# Patient Record
Sex: Female | Born: 1940 | Race: Black or African American | Hispanic: No | Marital: Single | Smoking: Never smoker
Health system: Southern US, Community
[De-identification: ages and names within clinical notes are randomized; demographics above are authoritative.]

## PROBLEM LIST (undated history)

## (undated) DIAGNOSIS — E119 Type 2 diabetes mellitus without complications: Secondary | ICD-10-CM

## (undated) DIAGNOSIS — I10 Essential (primary) hypertension: Secondary | ICD-10-CM

## (undated) DIAGNOSIS — M1990 Unspecified osteoarthritis, unspecified site: Secondary | ICD-10-CM

## (undated) DIAGNOSIS — E7800 Pure hypercholesterolemia, unspecified: Secondary | ICD-10-CM

## (undated) DIAGNOSIS — K219 Gastro-esophageal reflux disease without esophagitis: Secondary | ICD-10-CM

## (undated) HISTORY — PX: TONSILLECTOMY: SUR1361

## (undated) HISTORY — PX: TUBAL LIGATION: SHX77

## (undated) HISTORY — PX: JOINT REPLACEMENT: SHX530

---

## 2011-03-26 DIAGNOSIS — H2510 Age-related nuclear cataract, unspecified eye: Secondary | ICD-10-CM | POA: Insufficient documentation

## 2011-03-26 DIAGNOSIS — H40003 Preglaucoma, unspecified, bilateral: Secondary | ICD-10-CM | POA: Insufficient documentation

## 2011-03-26 DIAGNOSIS — E119 Type 2 diabetes mellitus without complications: Secondary | ICD-10-CM | POA: Insufficient documentation

## 2011-03-26 DIAGNOSIS — H524 Presbyopia: Secondary | ICD-10-CM | POA: Insufficient documentation

## 2011-03-26 DIAGNOSIS — H5200 Hypermetropia, unspecified eye: Secondary | ICD-10-CM | POA: Insufficient documentation

## 2015-03-09 ENCOUNTER — Other Ambulatory Visit: Payer: Self-pay

## 2015-03-09 ENCOUNTER — Other Ambulatory Visit: Payer: Self-pay | Admitting: Internal Medicine

## 2015-03-09 DIAGNOSIS — Z1231 Encounter for screening mammogram for malignant neoplasm of breast: Secondary | ICD-10-CM

## 2015-03-09 DIAGNOSIS — E2839 Other primary ovarian failure: Secondary | ICD-10-CM

## 2015-03-29 ENCOUNTER — Inpatient Hospital Stay: Admission: RE | Admit: 2015-03-29 | Payer: Self-pay | Source: Ambulatory Visit

## 2015-04-08 ENCOUNTER — Ambulatory Visit
Admission: RE | Admit: 2015-04-08 | Discharge: 2015-04-08 | Disposition: A | Discharge status: Home / Self Care | Payer: Medicare HMO | Source: Ambulatory Visit

## 2015-04-08 ENCOUNTER — Ambulatory Visit
Admission: RE | Admit: 2015-04-08 | Discharge: 2015-04-08 | Disposition: A | Discharge status: Home / Self Care | Payer: Medicare HMO | Source: Ambulatory Visit | Attending: Internal Medicine | Admitting: Internal Medicine

## 2015-04-08 DIAGNOSIS — E2839 Other primary ovarian failure: Secondary | ICD-10-CM

## 2015-04-08 DIAGNOSIS — Z1231 Encounter for screening mammogram for malignant neoplasm of breast: Secondary | ICD-10-CM

## 2015-05-04 ENCOUNTER — Other Ambulatory Visit: Payer: Self-pay | Admitting: Internal Medicine

## 2015-05-04 DIAGNOSIS — R928 Other abnormal and inconclusive findings on diagnostic imaging of breast: Secondary | ICD-10-CM

## 2015-05-11 ENCOUNTER — Other Ambulatory Visit: Payer: Self-pay | Admitting: Internal Medicine

## 2015-05-11 ENCOUNTER — Ambulatory Visit
Admission: RE | Admit: 2015-05-11 | Discharge: 2015-05-11 | Disposition: A | Discharge status: Home / Self Care | Payer: Medicare HMO | Source: Ambulatory Visit | Attending: Internal Medicine | Admitting: Internal Medicine

## 2015-05-11 DIAGNOSIS — R928 Other abnormal and inconclusive findings on diagnostic imaging of breast: Secondary | ICD-10-CM

## 2015-05-16 ENCOUNTER — Other Ambulatory Visit: Payer: Self-pay | Admitting: Internal Medicine

## 2015-05-16 ENCOUNTER — Ambulatory Visit
Admission: RE | Admit: 2015-05-16 | Discharge: 2015-05-16 | Disposition: A | Discharge status: Home / Self Care | Payer: Medicare HMO | Source: Ambulatory Visit | Attending: Internal Medicine | Admitting: Internal Medicine

## 2015-05-16 DIAGNOSIS — R928 Other abnormal and inconclusive findings on diagnostic imaging of breast: Secondary | ICD-10-CM

## 2015-05-30 ENCOUNTER — Ambulatory Visit: Payer: Self-pay | Admitting: Surgery

## 2015-05-30 NOTE — H&P (Signed)
Lisa Alexander 05/30/2015 1:36 PM Location: [facility] Patient #: [id number] DOB: 16-Jul-1940 Single / Language: [place] / Race: Black or African American Female  History of Present Illness ([name] MD; 05/30/2015 4:03 PM) Patient words: Patient is sent at the request of Dr. 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 Patient denies any history of breast pain, nipple discharge or change the appearance of either breast. This was picked up due to abnormal mammogram. She has no complaints.                   CLINICAL DATA: The patient returns after screening study for evaluation possible distortion in the right breast. EXAM: DIGITAL DIAGNOSTIC RIGHT MAMMOGRAM WITH 3D TOMOSYNTHESIS WITH CAD ULTRASOUND RIGHT BREAST COMPARISON: 04/08/2015 and earlier ACR Breast Density Category b: There are scattered areas of fibroglandular density. FINDINGS: Additional views are performed, confirming presence of distortion in the lateral portion of the right breast. This is not associated with a mass and therefore is more difficult to measure. However the area of distortion is estimated to be 1.9 cm in diameter. Mammographic images were processed with CAD. On physical exam, I palpate no abnormality in the lateral aspect of the right breast. Targeted ultrasound is performed, showing normal appearing fibroglandular tissue throughout the lateral and retroareolar portion of the right breast. IMPRESSION: 1. Persistent, suspicious distortion in the lateral portion of the right breast warranting tissue diagnosis. 2. No sonographic correlate for this finding. RECOMMENDATION: Stereotactic guided core biopsy is recommended and scheduled for the patient at the 05/16/2015 at 11 o'clock a.m. Patient takes a baby aspirin daily because of a history of hypertension and diabetes. She will discontinue aspirin 5 days prior to  biopsy. I have discussed the findings and recommendations with the patient. Results were also provided in writing at the conclusion of the visit. If applicable, a reminder letter will be sent to the patient regarding the next appointment. BI-RADS CATEGORY 4: Suspicious. Electronically Signed By: Norva Pavlov M.D. On: 05/11/2015 14:51          Diagnosis Breast, right, needle core biopsy, UOQ - COMPLEX SCLEROSING LESION, SEE COMMENT.  The patient is a 75 year old female.   Other Problems Lisa Alexander, CMA; 05/30/2015 1:36 PM) Arthritis Asthma Bladder Problems Diabetes Mellitus Gastroesophageal Reflux Disease High blood pressure  Past Surgical History Lisa Alexander, CMA; 05/30/2015 1:36 PM) Breast Biopsy Right. Colon Polyp Removal - Colonoscopy Colon Polyp Removal - Open Knee Surgery Left. Tonsillectomy  Diagnostic Studies History Lisa Alexander, [place]; 05/30/2015 1:36 PM) Colonoscopy within last year Mammogram 1-3 years ago  Allergies Lisa Alexander, CMA; 05/30/2015 1:37 PM) No Known Drug Allergies 05/30/2015  Medication History Lisa Alexander, CMA; 05/30/2015 1:41 PM) Omeprazole (  Capsule DR, Oral) Active. Aspirin (  Tablet, Oral) Active. Simvastatin (  Tablet, Oral) Active. MetFORMIN HCl (  Tablet, Oral) Active. Atenolol (  Tablet, Oral) Active. Ibuprofen (  Tablet, Oral) Active. Valsartan-Hydrochlorothiazide (320-12.5MG  Tablet, Oral) Active. Insurance claims handler (Oral) Active. Tanzeum (  Pen-injector, Subcutaneous) Active. Medications Reconciled Lantus (100UNIT/ML Solution, Subcutaneous) Active.  Social History Lisa Alexander, [place]; 05/30/2015 1:36 PM) Caffeine use Carbonated beverages. No alcohol use No drug use Tobacco use Never smoker.  Family History Lisa Alexander, [place]; 05/30/2015 1:36 PM) Arthritis Mother. Diabetes Mellitus Sister. Hypertension Mother, Son.  Pregnancy / Birth History Lisa Alexander, CMA;  05/30/2015 1:36 PM) Age at menarche 12 years. Age of menopause 44-50 Contraceptive History Oral contraceptives. Gravida 4 Maternal age 32-20 Para 4  Review of Systems Lisa Alexander CMA; 05/30/2015 1:36 PM) General Present- Chills and Weight Loss. Not Present- Appetite Loss, Fatigue, Fever, Night Sweats and Weight Gain. Skin Present- Dryness. Not Present- Change in Wart/Mole, Hives, Jaundice, New Lesions, Non-Healing Wounds, Rash and Ulcer. HEENT Present- Hoarseness, Seasonal Allergies, Sinus Pain, Visual Disturbances and Wears glasses/contact lenses. Not Present- Earache, Hearing Loss, Nose Bleed, Oral Ulcers, Ringing in the Ears, Sore Throat and Yellow Eyes. Respiratory Present- Snoring. Not Present- Bloody sputum, Chronic Cough, Difficulty Breathing and Wheezing. Cardiovascular Present- Leg Cramps. Not Present- Chest Pain, Difficulty Breathing Lying Down, Palpitations, Rapid Heart Rate, Shortness of Breath and Swelling of Extremities. Gastrointestinal Present- Excessive gas. Not Present- Abdominal Pain, Bloating, Bloody Stool, Change in Bowel Habits, Chronic diarrhea, Constipation, Difficulty Swallowing, Gets full quickly at meals, Hemorrhoids, Indigestion, Nausea, Rectal Pain and Vomiting. Female Genitourinary Present- Frequency, Nocturia and Urgency. Not Present- Painful Urination and Pelvic Pain. Musculoskeletal Present- Joint Stiffness and Muscle Pain. Not Present- Back Pain, Joint Pain, Muscle Weakness and Swelling of Extremities. Neurological Present- Headaches, Trouble walking and Weakness. Not Present- Decreased Memory, Fainting, Numbness, Seizures, Tingling and Tremor. Endocrine Present- Hair Changes. Not Present- Cold Intolerance, Excessive Hunger, Heat Intolerance, Hot flashes and New Diabetes. Hematology Present- Easy Bruising. Not Present- Excessive bleeding, Gland problems, HIV and Persistent Infections.  Vitals Lisa Alexander CMA; 05/30/2015 1:40 PM) 05/30/2015 1:40  PM Weight: 188.8 lb Height: 63in Body Surface Area: 1.89 m Body Mass Index: 33.44 kg/m  Temp.: 97.74F(Temporal)  Pulse: 60 (Regular)  BP: 128/78 (Sitting, Left Arm, Standard)      Physical Exam (Tasmin Exantus A. Tawnee Clegg MD; 05/30/2015 4:04 PM)  General Mental Status-Alert. General Appearance-Consistent with stated age. Hydration-Well hydrated. Voice-Normal.  Head and Neck Head-normocephalic, atraumatic with no lesions or palpable masses. Trachea-midline. Thyroid Gland Characteristics - normal size and consistency.  Breast Note: Right breast swelling at core biopsy site. No left breast mass noted.  Cardiovascular Cardiovascular examination reveals -normal heart sounds, regular rate and rhythm with no murmurs and normal pedal pulses bilaterally.  Neurologic Neurologic evaluation reveals -alert and oriented x 3 with no impairment of recent or remote memory. Mental Status-Normal.  Lymphatic Axillary  General Axillary Region: Bilateral - Description - Normal. Tenderness - Non Tender.    Assessment & Plan ([place] Kievit A. Majed Pellegrin MD; 05/30/2015 4:04 PM)  BREAST MASS, RIGHT (N63)  MASS OF RIGHT BREAST ON MAMMOGRAM (N63) Impression: complex sclerosing lesion recommend right breast seed localized lumpectomy discussed observation/ serial mammograms low risk of malignancy discussed Risk of lumpectomy include bleeding, infection, seroma, more surgery, use of seed/wire, wound care, cosmetic deformity and the need for other treatments, death , blood clots, death. Pt agrees to proceed.  Current Plans You are being scheduled for surgery - Our schedulers will call you.  You should hear from our office's scheduling department within 5 working days about the location, date, and time of surgery. We try to make accommodations for patient's preferences in scheduling surgery, but sometimes the OR schedule or the surgeon's schedule prevents [place] from making those  accommodations.  If you have not heard from our office [phone]) in 5 working days, call the office and ask for your surgeon's nurse.  If you have other questions about your diagnosis, plan, or surgery, call the office and ask for your surgeon's nurse.  Pt Education - [facility] Breast Biopsy HCI: discussed with patient and provided information. The anatomy and the physiology was discussed. The pathophysiology and natural history of the disease was discussed. Options were discussed and  recommendations were made. Technique, risks, benefits, & alternatives were discussed. Risks such as stroke, heart attack, bleeding, indection, death, and other risks discussed. Questions answered. The patient agrees to proceed.

## 2015-06-09 ENCOUNTER — Other Ambulatory Visit: Payer: Self-pay | Admitting: Surgery

## 2015-06-09 ENCOUNTER — Ambulatory Visit: Payer: Self-pay | Admitting: Surgery

## 2015-06-09 DIAGNOSIS — N6310 Unspecified lump in the right breast, unspecified quadrant: Secondary | ICD-10-CM

## 2015-06-14 ENCOUNTER — Encounter (HOSPITAL_BASED_OUTPATIENT_CLINIC_OR_DEPARTMENT_OTHER): Payer: Self-pay | Admitting: *Deleted

## 2015-06-15 ENCOUNTER — Other Ambulatory Visit: Payer: Self-pay

## 2015-06-15 ENCOUNTER — Encounter (HOSPITAL_BASED_OUTPATIENT_CLINIC_OR_DEPARTMENT_OTHER)
Admission: RE | Admit: 2015-06-15 | Discharge: 2015-06-15 | Disposition: A | Discharge status: Home / Self Care | Payer: Medicare HMO | Source: Ambulatory Visit | Attending: Surgery | Admitting: Surgery

## 2015-06-15 DIAGNOSIS — N63 Unspecified lump in breast: Secondary | ICD-10-CM | POA: Diagnosis not present

## 2015-06-15 DIAGNOSIS — Z01810 Encounter for preprocedural cardiovascular examination: Secondary | ICD-10-CM | POA: Insufficient documentation

## 2015-06-15 DIAGNOSIS — Z01812 Encounter for preprocedural laboratory examination: Secondary | ICD-10-CM | POA: Diagnosis present

## 2015-06-15 DIAGNOSIS — I10 Essential (primary) hypertension: Secondary | ICD-10-CM | POA: Insufficient documentation

## 2015-06-15 DIAGNOSIS — Z833 Family history of diabetes mellitus: Secondary | ICD-10-CM | POA: Diagnosis not present

## 2015-06-15 DIAGNOSIS — E119 Type 2 diabetes mellitus without complications: Secondary | ICD-10-CM | POA: Diagnosis not present

## 2015-06-15 DIAGNOSIS — R928 Other abnormal and inconclusive findings on diagnostic imaging of breast: Secondary | ICD-10-CM | POA: Diagnosis not present

## 2015-06-15 LAB — BASIC METABOLIC PANEL
Anion gap: 8 (ref 5–15)
BUN: 30 mg/dL — AB (ref 6–20)
CHLORIDE: 102 mmol/L (ref 101–111)
CO2: 29 mmol/L (ref 22–32)
CREATININE: 1.21 mg/dL — AB (ref 0.44–1.00)
Calcium: 10.3 mg/dL (ref 8.9–10.3)
GFR calc Af Amer: 50 mL/min — ABNORMAL LOW (ref >60)
GFR calc non Af Amer: 43 mL/min — ABNORMAL LOW (ref >60)
Glucose, Bld: 102 mg/dL — ABNORMAL HIGH (ref 65–99)
Potassium: 4.7 mmol/L (ref 3.5–5.1)
Sodium: 139 mmol/L (ref 135–145)

## 2015-06-16 ENCOUNTER — Ambulatory Visit
Admission: RE | Admit: 2015-06-16 | Discharge: 2015-06-16 | Disposition: A | Discharge status: Home / Self Care | Payer: Medicare HMO | Source: Ambulatory Visit | Attending: Surgery | Admitting: Surgery

## 2015-06-16 DIAGNOSIS — N6310 Unspecified lump in the right breast, unspecified quadrant: Secondary | ICD-10-CM

## 2015-06-16 NOTE — Progress Notes (Addendum)
Dr.Judd reviewed BUN 30 and  Creat 1.21 - ok for surgery. Dr. Blima Dessert reviewed Ekg - ok for Surgery

## 2015-06-21 ENCOUNTER — Ambulatory Visit
Admission: RE | Admit: 2015-06-21 | Discharge: 2015-06-21 | Disposition: A | Discharge status: Home / Self Care | Payer: Medicare HMO | Source: Ambulatory Visit | Attending: Surgery | Admitting: Surgery

## 2015-06-21 ENCOUNTER — Encounter (HOSPITAL_BASED_OUTPATIENT_CLINIC_OR_DEPARTMENT_OTHER)
Admission: RE | Disposition: A | Discharge status: Home / Self Care | Payer: Self-pay | Source: Ambulatory Visit | Attending: Surgery

## 2015-06-21 ENCOUNTER — Ambulatory Visit (HOSPITAL_BASED_OUTPATIENT_CLINIC_OR_DEPARTMENT_OTHER): Payer: Medicare HMO | Admitting: Anesthesiology

## 2015-06-21 ENCOUNTER — Encounter (HOSPITAL_BASED_OUTPATIENT_CLINIC_OR_DEPARTMENT_OTHER): Payer: Self-pay | Admitting: *Deleted

## 2015-06-21 ENCOUNTER — Ambulatory Visit (HOSPITAL_BASED_OUTPATIENT_CLINIC_OR_DEPARTMENT_OTHER)
Admission: RE | Admit: 2015-06-21 | Discharge: 2015-06-21 | Disposition: A | Discharge status: Home / Self Care | Payer: Medicare HMO | Source: Ambulatory Visit | Attending: Surgery | Admitting: Surgery

## 2015-06-21 DIAGNOSIS — Z833 Family history of diabetes mellitus: Secondary | ICD-10-CM | POA: Diagnosis not present

## 2015-06-21 DIAGNOSIS — N63 Unspecified lump in breast: Secondary | ICD-10-CM | POA: Diagnosis not present

## 2015-06-21 DIAGNOSIS — E119 Type 2 diabetes mellitus without complications: Secondary | ICD-10-CM | POA: Diagnosis not present

## 2015-06-21 DIAGNOSIS — R928 Other abnormal and inconclusive findings on diagnostic imaging of breast: Secondary | ICD-10-CM | POA: Insufficient documentation

## 2015-06-21 DIAGNOSIS — N6310 Unspecified lump in the right breast, unspecified quadrant: Secondary | ICD-10-CM

## 2015-06-21 DIAGNOSIS — N649 Disorder of breast, unspecified: Secondary | ICD-10-CM | POA: Insufficient documentation

## 2015-06-21 HISTORY — PX: BREAST LUMPECTOMY WITH RADIOACTIVE SEED LOCALIZATION: SHX6424

## 2015-06-21 HISTORY — DX: Essential (primary) hypertension: I10

## 2015-06-21 HISTORY — DX: Type 2 diabetes mellitus without complications: E11.9

## 2015-06-21 HISTORY — DX: Gastro-esophageal reflux disease without esophagitis: K21.9

## 2015-06-21 HISTORY — DX: Unspecified osteoarthritis, unspecified site: M19.90

## 2015-06-21 HISTORY — DX: Pure hypercholesterolemia, unspecified: E78.00

## 2015-06-21 LAB — GLUCOSE, CAPILLARY
GLUCOSE-CAPILLARY: 113 mg/dL — AB (ref 65–99)
Glucose-Capillary: 126 mg/dL — ABNORMAL HIGH (ref 65–99)

## 2015-06-21 SURGERY — BREAST LUMPECTOMY WITH RADIOACTIVE SEED LOCALIZATION
Anesthesia: General | Site: Breast | Laterality: Right

## 2015-06-21 MED ORDER — LIDOCAINE HCL (CARDIAC) 20 MG/ML IV SOLN
INTRAVENOUS | Status: AC
  Filled 2015-06-21: qty 5

## 2015-06-21 MED ORDER — FENTANYL CITRATE (PF) 100 MCG/2ML IJ SOLN
INTRAMUSCULAR | Status: AC
Start: 2015-06-21 — End: 2015-06-21
  Filled 2015-06-21: qty 2

## 2015-06-21 MED ORDER — BUPIVACAINE HCL (PF) 0.25 % IJ SOLN
INTRAMUSCULAR | Status: DC | PRN
  Administered 2015-06-21: 20 mL

## 2015-06-21 MED ORDER — SCOPOLAMINE 1 MG/3DAYS TD PT72: 1.0000 | MEDICATED_PATCH | Freq: Once | TRANSDERMAL | Status: DC | PRN

## 2015-06-21 MED ORDER — CHLORHEXIDINE GLUCONATE 4 % EX LIQD: 1.0000 "application " | Freq: Once | CUTANEOUS | Status: DC

## 2015-06-21 MED ORDER — CEFAZOLIN SODIUM-DEXTROSE 2-3 GM-% IV SOLR
INTRAVENOUS | Status: AC
  Filled 2015-06-21: qty 50

## 2015-06-21 MED ORDER — SUCCINYLCHOLINE CHLORIDE 20 MG/ML IJ SOLN
INTRAMUSCULAR | Status: AC
  Filled 2015-06-21: qty 1

## 2015-06-21 MED ORDER — OXYCODONE HCL 5 MG PO TABS: 5.0000 mg | ORAL_TABLET | Freq: Once | ORAL | Status: DC | PRN

## 2015-06-21 MED ORDER — ONDANSETRON HCL 4 MG/2ML IJ SOLN
INTRAMUSCULAR | Status: AC
  Filled 2015-06-21: qty 2

## 2015-06-21 MED ORDER — OXYCODONE HCL 5 MG/5ML PO SOLN: 5.0000 mg | Freq: Once | ORAL | Status: DC | PRN

## 2015-06-21 MED ORDER — DEXTROSE 5 % IV SOLN
3.0000 g | INTRAVENOUS | Status: AC
  Administered 2015-06-21: 2 g via INTRAVENOUS

## 2015-06-21 MED ORDER — DEXAMETHASONE SODIUM PHOSPHATE 4 MG/ML IJ SOLN
INTRAMUSCULAR | Status: DC | PRN
  Administered 2015-06-21: 5 mg via INTRAVENOUS

## 2015-06-21 MED ORDER — PROPOFOL 10 MG/ML IV BOLUS
INTRAVENOUS | Status: AC
  Filled 2015-06-21: qty 20

## 2015-06-21 MED ORDER — TRAMADOL HCL 50 MG PO TABS: 50.0000 mg | ORAL_TABLET | Freq: Four times a day (QID) | ORAL | Status: DC | PRN

## 2015-06-21 MED ORDER — DEXTROSE 5 % IV SOLN: 3.0000 g | INTRAVENOUS | Status: DC

## 2015-06-21 MED ORDER — FENTANYL CITRATE (PF) 100 MCG/2ML IJ SOLN
50.0000 ug | INTRAMUSCULAR | Status: DC | PRN
  Administered 2015-06-21 (×2): 50 ug via INTRAVENOUS

## 2015-06-21 MED ORDER — EPHEDRINE SULFATE 50 MG/ML IJ SOLN
INTRAMUSCULAR | Status: DC | PRN
  Administered 2015-06-21: 10 mg via INTRAVENOUS
  Administered 2015-06-21: 15 mg via INTRAVENOUS
  Administered 2015-06-21: 10 mg via INTRAVENOUS

## 2015-06-21 MED ORDER — MEPERIDINE HCL 25 MG/ML IJ SOLN: 6.2500 mg | INTRAMUSCULAR | Status: DC | PRN

## 2015-06-21 MED ORDER — LACTATED RINGERS IV SOLN
INTRAVENOUS | Status: DC
  Administered 2015-06-21 (×2): via INTRAVENOUS

## 2015-06-21 MED ORDER — ONDANSETRON HCL 4 MG/2ML IJ SOLN
INTRAMUSCULAR | Status: DC | PRN
Start: 2015-06-21 — End: 2015-06-21
  Administered 2015-06-21: 4 mg via INTRAVENOUS

## 2015-06-21 MED ORDER — SUCCINYLCHOLINE CHLORIDE 20 MG/ML IJ SOLN
INTRAMUSCULAR | Status: DC | PRN
  Administered 2015-06-21: 60 mg via INTRAVENOUS

## 2015-06-21 MED ORDER — LIDOCAINE HCL (CARDIAC) 20 MG/ML IV SOLN: INTRAVENOUS | Status: DC | PRN

## 2015-06-21 MED ORDER — HYDROMORPHONE HCL 1 MG/ML IJ SOLN: 0.2500 mg | INTRAMUSCULAR | Status: DC | PRN

## 2015-06-21 MED ORDER — PROPOFOL 10 MG/ML IV BOLUS
INTRAVENOUS | Status: DC | PRN
  Administered 2015-06-21 (×2): 50 mg via INTRAVENOUS
  Administered 2015-06-21: 130 mg via INTRAVENOUS

## 2015-06-21 MED ORDER — LIDOCAINE HCL (CARDIAC) 20 MG/ML IV SOLN
INTRAVENOUS | Status: DC | PRN
  Administered 2015-06-21: 80 mg via INTRAVENOUS
  Administered 2015-06-21: 20 mg via INTRAVENOUS

## 2015-06-21 MED ORDER — MIDAZOLAM HCL 2 MG/2ML IJ SOLN: 1.0000 mg | INTRAMUSCULAR | Status: DC | PRN

## 2015-06-21 MED ORDER — GLYCOPYRROLATE 0.2 MG/ML IJ SOLN: 0.2000 mg | Freq: Once | INTRAMUSCULAR | Status: DC | PRN

## 2015-06-21 MED ORDER — DEXAMETHASONE SODIUM PHOSPHATE 10 MG/ML IJ SOLN
INTRAMUSCULAR | Status: AC
  Filled 2015-06-21: qty 1

## 2015-06-21 SURGICAL SUPPLY — 49 items
APPLIER CLIP 9.375 MED OPEN (MISCELLANEOUS)
BINDER BREAST LRG (GAUZE/BANDAGES/DRESSINGS) IMPLANT
BINDER BREAST MEDIUM (GAUZE/BANDAGES/DRESSINGS) IMPLANT
BINDER BREAST XLRG (GAUZE/BANDAGES/DRESSINGS) ×2 IMPLANT
BINDER BREAST XXLRG (GAUZE/BANDAGES/DRESSINGS) IMPLANT
BLADE SURG 15 STRL LF DISP TIS (BLADE) ×1 IMPLANT
BLADE SURG 15 STRL SS (BLADE) ×1
CANISTER SUC SOCK COL 7IN (MISCELLANEOUS) IMPLANT
CANISTER SUCT 1200ML W/VALVE (MISCELLANEOUS) IMPLANT
CHLORAPREP W/TINT 26ML (MISCELLANEOUS) ×2 IMPLANT
CLIP APPLIE 9.375 MED OPEN (MISCELLANEOUS) IMPLANT
COVER BACK TABLE 60X90IN (DRAPES) ×2 IMPLANT
COVER MAYO STAND STRL (DRAPES) ×2 IMPLANT
COVER PROBE W GEL 5X96 (DRAPES) ×2 IMPLANT
DECANTER SPIKE VIAL GLASS SM (MISCELLANEOUS) IMPLANT
DEVICE DUBIN W/COMP PLATE 8390 (MISCELLANEOUS) ×2 IMPLANT
DRAPE LAPAROSCOPIC ABDOMINAL (DRAPES) IMPLANT
DRAPE LAPAROTOMY 100X72 PEDS (DRAPES) ×2 IMPLANT
DRAPE UTILITY XL STRL (DRAPES) ×2 IMPLANT
ELECT COATED BLADE 2.86 ST (ELECTRODE) ×2 IMPLANT
ELECT REM PT RETURN 9FT ADLT (ELECTROSURGICAL) ×2
ELECTRODE REM PT RTRN 9FT ADLT (ELECTROSURGICAL) ×1 IMPLANT
GLOVE BIO SURGEON STRL SZ 6.5 (GLOVE) ×2 IMPLANT
GLOVE BIOGEL PI IND STRL 7.0 (GLOVE) ×1 IMPLANT
GLOVE BIOGEL PI IND STRL 8 (GLOVE) ×1 IMPLANT
GLOVE BIOGEL PI INDICATOR 7.0 (GLOVE) ×1
GLOVE BIOGEL PI INDICATOR 8 (GLOVE) ×1
GLOVE ECLIPSE 8.0 STRL XLNG CF (GLOVE) ×2 IMPLANT
GLOVE SURG SS PI 6.5 STRL IVOR (GLOVE) ×2 IMPLANT
GLOVE SURG SS PI 7.5 STRL IVOR (GLOVE) ×2 IMPLANT
GOWN STRL REUS W/ TWL LRG LVL3 (GOWN DISPOSABLE) ×2 IMPLANT
GOWN STRL REUS W/TWL LRG LVL3 (GOWN DISPOSABLE) ×2
HEMOSTAT SNOW SURGICEL 2X4 (HEMOSTASIS) IMPLANT
KIT MARKER MARGIN INK (KITS) ×2 IMPLANT
LIQUID BAND (GAUZE/BANDAGES/DRESSINGS) ×2 IMPLANT
NEEDLE HYPO 25X1 1.5 SAFETY (NEEDLE) ×2 IMPLANT
NS IRRIG 1000ML POUR BTL (IV SOLUTION) ×2 IMPLANT
PACK BASIN DAY SURGERY FS (CUSTOM PROCEDURE TRAY) ×2 IMPLANT
PENCIL BUTTON HOLSTER BLD 10FT (ELECTRODE) ×2 IMPLANT
SLEEVE SCD COMPRESS KNEE MED (MISCELLANEOUS) ×2 IMPLANT
SPONGE LAP 4X18 X RAY DECT (DISPOSABLE) ×2 IMPLANT
SUT MNCRL AB 4-0 PS2 18 (SUTURE) ×2 IMPLANT
SUT SILK 2 0 SH (SUTURE) IMPLANT
SUT VICRYL 3-0 CR8 SH (SUTURE) ×2 IMPLANT
SYR CONTROL 10ML LL (SYRINGE) ×2 IMPLANT
TOWEL OR 17X24 6PK STRL BLUE (TOWEL DISPOSABLE) ×2 IMPLANT
TOWEL OR NON WOVEN STRL DISP B (DISPOSABLE) ×2 IMPLANT
TUBE CONNECTING 20X1/4 (TUBING) IMPLANT
YANKAUER SUCT BULB TIP NO VENT (SUCTIONS) IMPLANT

## 2015-06-21 NOTE — Transfer of Care (Signed)
  Last Vitals:  Filed Vitals:   06/21/15 0752  BP: 126/67  Pulse: 70  Temp: 36.5 C  Resp: 18   Immediate Anesthesia Transfer of Care Note  Patient: Lisa Alexander  Procedure(s) Performed: Procedure(s) (LRB): BREAST LUMPECTOMY WITH RADIOACTIVE SEED LOCALIZATION (Right)  Patient Location: PACU  Anesthesia Type: General  Level of Consciousness: awake, alert  and oriented  Airway & Oxygen Therapy: Patient Spontanous Breathing and Patient connected to face mask oxygen  Post-op Assessment: Report given to PACU RN and Post -op Vital signs reviewed and stable  Post vital signs: Reviewed and stable  Complications: No apparent anesthesia complications

## 2015-06-21 NOTE — Anesthesia Preprocedure Evaluation (Signed)
Anesthesia Evaluation  Patient identified by MRN, date of birth, ID band Patient awake    Reviewed: Allergy & Precautions, NPO status , Patient's Chart, lab work & pertinent test results  Airway Mallampati: I  TM Distance: >3 FB Neck ROM: Full    Dental  (+) Teeth Intact, Dental Advisory Given   Pulmonary    breath sounds clear to auscultation       Cardiovascular hypertension, Pt. on medications and Pt. on home beta blockers  Rhythm:Regular Rate:Normal     Neuro/Psych    GI/Hepatic GERD  Medicated and Controlled,  Endo/Other  diabetesMorbid obesity  Renal/GU      Musculoskeletal   Abdominal   Peds  Hematology   Anesthesia Other Findings   Reproductive/Obstetrics                             Anesthesia Physical Anesthesia Plan  ASA: III  Anesthesia Plan: General   Post-op Pain Management:    Induction: Intravenous  Airway Management Planned: LMA  Additional Equipment:   Intra-op Plan:   Post-operative Plan: Extubation in OR  Informed Consent: I have reviewed the patients History and Physical, chart, labs and discussed the procedure including the risks, benefits and alternatives for the proposed anesthesia with the patient or authorized representative who has indicated his/her understanding and acceptance.   Dental advisory given  Plan Discussed with: CRNA, Anesthesiologist and Surgeon  Anesthesia Plan Comments:         Anesthesia Quick Evaluation

## 2015-06-21 NOTE — Discharge Instructions (Signed)
[facility] °Office Phone Number [phone] ° °BREAST BIOPSY/ PARTIAL MASTECTOMY: POST OP INSTRUCTIONS ° °Always review your discharge instruction sheet given to you by the facility where your surgery was performed. ° °IF YOU HAVE DISABILITY OR FAMILY LEAVE FORMS, YOU MUST BRING THEM TO THE OFFICE FOR PROCESSING.  DO NOT GIVE THEM TO YOUR DOCTOR. ° °1. A prescription for pain medication may be given to you upon discharge.  Take your pain medication as prescribed, if needed.  If narcotic pain medicine is not needed, then you may take acetaminophen (Tylenol) or ibuprofen (Advil) as needed. °2. Take your usually prescribed medications unless otherwise directed °3. If you need a refill on your pain medication, please contact your pharmacy.  They will contact our office to request authorization.  Prescriptions will not be filled after 5pm or on week-ends. °4. You should eat very light the first 24 hours after surgery, such as soup, crackers, pudding, etc.  Resume your normal diet the day after surgery. °5. Most patients will experience some swelling and bruising in the breast.  Ice packs and a good support bra will help.  Swelling and bruising can take several days to resolve.  °6. It is common to experience some constipation if taking pain medication after surgery.  Increasing fluid intake and taking a stool softener will usually help or prevent this problem from occurring.  A mild laxative (Milk of Magnesia or Miralax) should be taken according to package directions if there are no bowel movements after 48 hours. °7. Unless discharge instructions indicate otherwise, you may remove your bandages 24-48 hours after surgery, and you may shower at that time.  You may have steri-strips (small skin tapes) in place directly over the incision.  These strips should be left on the skin for 7-10 days.  If your surgeon used skin glue on the incision, you may shower in 24 hours.  The glue will flake off over the  next 2-3 weeks.  Any sutures or staples will be removed at the office during your follow-up visit. °8. ACTIVITIES:  You may resume regular daily activities (gradually increasing) beginning the next day.  Wearing a good support bra or sports bra minimizes pain and swelling.  You may have sexual intercourse when it is comfortable. °a. You may drive when you no longer are taking prescription pain medication, you can comfortably wear a seatbelt, and you can safely maneuver your car and apply brakes. °b. RETURN TO WORK:  ______________________________________________________________________________________ °9. You should see your doctor in the office for a follow-up appointment approximately two weeks after your surgery.  Your doctor’s nurse will typically make your follow-up appointment when she calls you with your pathology report.  Expect your pathology report 2-3 business days after your surgery.  You may call to check if you do not hear from us after three days. °10. OTHER INSTRUCTIONS: _______________________________________________________________________________________________ _____________________________________________________________________________________________________________________________________ °_____________________________________________________________________________________________________________________________________ °_____________________________________________________________________________________________________________________________________ ° °WHEN TO CALL YOUR DOCTOR: °1. Fever over 101.0 °2. Nausea and/or vomiting. °3. Extreme swelling or bruising. °4. Continued bleeding from incision. °5. Increased pain, redness, or drainage from the incision. ° °The clinic staff is available to answer your questions during regular business hours.  Please don’t hesitate to call and ask to speak to one of the nurses for clinical concerns.  If you have a medical emergency, go to the nearest  emergency room or call 911.  A surgeon from [facility] is always on call at the hospital. ° °For further questions, please visit [email or web address]  ° ° ° °  Post Anesthesia Home Care Instructions ° °Activity: °Get plenty of rest for the remainder of the day. A responsible adult should stay with you for 24 hours following the procedure.  °For the next 24 hours, DO NOT: °-Drive a car °-Operate machinery °-Drink alcoholic beverages °-Take any medication unless instructed by your physician °-Make any legal decisions or sign important papers. ° °Meals: °Start with liquid foods such as gelatin or soup. Progress to regular foods as tolerated. Avoid greasy, spicy, heavy foods. If nausea and/or vomiting occur, drink only clear liquids until the nausea and/or vomiting subsides. Call your physician if vomiting continues. ° °Special Instructions/Symptoms: °Your throat may feel dry or sore from the anesthesia or the breathing tube placed in your throat during surgery. If this causes discomfort, gargle with warm salt water. The discomfort should disappear within 24 hours. ° °If you had a scopolamine patch placed behind your ear for the management of post- operative nausea and/or vomiting: ° °1. The medication in the patch is effective for 72 hours, after which it should be removed.  Wrap patch in a tissue and discard in the trash. Wash hands thoroughly with soap and water. °2. You may remove the patch earlier than 72 hours if you experience unpleasant side effects which may include dry mouth, dizziness or visual disturbances. °3. Avoid touching the patch. Wash your hands with soap and water after contact with the patch. °  ° °

## 2015-06-21 NOTE — H&P (View-Only) (Signed)
Lisa Alexander 05/30/2015 1:36 PM Location: [facility] Patient #: [id number] DOB: 16-Jul-1940 Single / Language: [place] / Race: Black or African American Female  History of Present Illness (Tayvon Culley A. Jaquaya Coyle MD; 05/30/2015 4:03 PM) Patient words: Patient is sent at the request of Dr. 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 Patient denies any history of breast pain, nipple discharge or change the appearance of either breast. This was picked up due to abnormal mammogram. She has no complaints.                   CLINICAL DATA: The patient returns after screening study for evaluation possible distortion in the right breast. EXAM: DIGITAL DIAGNOSTIC RIGHT MAMMOGRAM WITH 3D TOMOSYNTHESIS WITH CAD ULTRASOUND RIGHT BREAST COMPARISON: 04/08/2015 and earlier ACR Breast Density Category b: There are scattered areas of fibroglandular density. FINDINGS: Additional views are performed, confirming presence of distortion in the lateral portion of the right breast. This is not associated with a mass and therefore is more difficult to measure. However the area of distortion is estimated to be 1.9 cm in diameter. Mammographic images were processed with CAD. On physical exam, I palpate no abnormality in the lateral aspect of the right breast. Targeted ultrasound is performed, showing normal appearing fibroglandular tissue throughout the lateral and retroareolar portion of the right breast. IMPRESSION: 1. Persistent, suspicious distortion in the lateral portion of the right breast warranting tissue diagnosis. 2. No sonographic correlate for this finding. RECOMMENDATION: Stereotactic guided core biopsy is recommended and scheduled for the patient at the 05/16/2015 at 11 o'clock a.m. Patient takes a baby aspirin daily because of a history of hypertension and diabetes. She will discontinue aspirin 5 days prior to  biopsy. I have discussed the findings and recommendations with the patient. Results were also provided in writing at the conclusion of the visit. If applicable, a reminder letter will be sent to the patient regarding the next appointment. BI-RADS CATEGORY 4: Suspicious. Electronically Signed By: Norva Pavlov M.D. On: 05/11/2015 14:51          Diagnosis Breast, right, needle core biopsy, UOQ - COMPLEX SCLEROSING LESION, SEE COMMENT.  The patient is a 75 year old female.   Other Problems [name], CMA; 05/30/2015 1:36 PM) Arthritis Asthma Bladder Problems Diabetes Mellitus Gastroesophageal Reflux Disease High blood pressure  Past Surgical History [name], CMA; 05/30/2015 1:36 PM) Breast Biopsy Right. Colon Polyp Removal - Colonoscopy Colon Polyp Removal - Open Knee Surgery Left. Tonsillectomy  Diagnostic Studies History [name], [place]; 05/30/2015 1:36 PM) Colonoscopy within last year Mammogram 1-3 years ago  Allergies [name], CMA; 05/30/2015 1:37 PM) No Known Drug Allergies 05/30/2015  Medication History [name], CMA; 05/30/2015 1:41 PM) Omeprazole (  Capsule DR, Oral) Active. Aspirin (  Tablet, Oral) Active. Simvastatin (  Tablet, Oral) Active. MetFORMIN HCl (  Tablet, Oral) Active. Atenolol (  Tablet, Oral) Active. Ibuprofen (  Tablet, Oral) Active. Valsartan-Hydrochlorothiazide (320-12.5MG  Tablet, Oral) Active. Insurance claims handler (Oral) Active. Tanzeum (  Pen-injector, Subcutaneous) Active. Medications Reconciled Lantus (100UNIT/ML Solution, Subcutaneous) Active.  Social History [name], [place]; 05/30/2015 1:36 PM) Caffeine use Carbonated beverages. No alcohol use No drug use Tobacco use Never smoker.  Family History [name], [place]; 05/30/2015 1:36 PM) Arthritis Mother. Diabetes Mellitus Sister. Hypertension Mother, Son.  Pregnancy / Birth History [name], CMA;  05/30/2015 1:36 PM) Age at menarche 12 years. Age of menopause 44-50 Contraceptive History Oral contraceptives. Gravida 4 Maternal age 32-20 Para 4  Review of Systems [name] CMA; 05/30/2015 1:36 PM) General Present- Chills and Weight Loss. Not Present- Appetite Loss, Fatigue, Fever, Night Sweats and Weight Gain. Skin Present- Dryness. Not Present- Change in Wart/Mole, Hives, Jaundice, New Lesions, Non-Healing Wounds, Rash and Ulcer. HEENT Present- Hoarseness, Seasonal Allergies, Sinus Pain, Visual Disturbances and Wears glasses/contact lenses. Not Present- Earache, Hearing Loss, Nose Bleed, Oral Ulcers, Ringing in the Ears, Sore Throat and Yellow Eyes. Respiratory Present- Snoring. Not Present- Bloody sputum, Chronic Cough, Difficulty Breathing and Wheezing. Cardiovascular Present- Leg Cramps. Not Present- Chest Pain, Difficulty Breathing Lying Down, Palpitations, Rapid Heart Rate, Shortness of Breath and Swelling of Extremities. Gastrointestinal Present- Excessive gas. Not Present- Abdominal Pain, Bloating, Bloody Stool, Change in Bowel Habits, Chronic diarrhea, Constipation, Difficulty Swallowing, Gets full quickly at meals, Hemorrhoids, Indigestion, Nausea, Rectal Pain and Vomiting. Female Genitourinary Present- Frequency, Nocturia and Urgency. Not Present- Painful Urination and Pelvic Pain. Musculoskeletal Present- Joint Stiffness and Muscle Pain. Not Present- Back Pain, Joint Pain, Muscle Weakness and Swelling of Extremities. Neurological Present- Headaches, Trouble walking and Weakness. Not Present- Decreased Memory, Fainting, Numbness, Seizures, Tingling and Tremor. Endocrine Present- Hair Changes. Not Present- Cold Intolerance, Excessive Hunger, Heat Intolerance, Hot flashes and New Diabetes. Hematology Present- Easy Bruising. Not Present- Excessive bleeding, Gland problems, HIV and Persistent Infections.  Vitals [name] CMA; 05/30/2015 1:40 PM) 05/30/2015 1:40  PM Weight: 188.8 lb Height: 63in Body Surface Area: 1.89 m Body Mass Index: 33.44 kg/m  Temp.: 97.74F(Temporal)  Pulse: 60 (Regular)  BP: 128/78 (Sitting, Left Arm, Standard)      Physical Exam (Loriene Taunton A. Hiram Mciver MD; 05/30/2015 4:04 PM)  General Mental Status-Alert. General Appearance-Consistent with stated age. Hydration-Well hydrated. Voice-Normal.  Head and Neck Head-normocephalic, atraumatic with no lesions or palpable masses. Trachea-midline. Thyroid Gland Characteristics - normal size and consistency.  Breast Note: Right breast swelling at core biopsy site. No left breast mass noted.  Cardiovascular Cardiovascular examination reveals -normal heart sounds, regular rate and rhythm with no murmurs and normal pedal pulses bilaterally.  Neurologic Neurologic evaluation reveals -alert and oriented x 3 with no impairment of recent or remote memory. Mental Status-Normal.  Lymphatic Axillary  General Axillary Region: Bilateral - Description - Normal. Tenderness - Non Tender.    Assessment & Plan (Weslie Rasmus A. Sharanda Shinault MD; 05/30/2015 4:04 PM)  BREAST MASS, RIGHT (N63)  MASS OF RIGHT BREAST ON MAMMOGRAM (N63) Impression: complex sclerosing lesion recommend right breast seed localized lumpectomy discussed observation/ serial mammograms low risk of malignancy discussed Risk of lumpectomy include bleeding, infection, seroma, more surgery, use of seed/wire, wound care, cosmetic deformity and the need for other treatments, death , blood clots, death. Pt agrees to proceed.  Current Plans You are being scheduled for surgery - Our schedulers will call you.  You should hear from our office's scheduling department within 5 working days about the location, date, and time of surgery. We try to make accommodations for patient's preferences in scheduling surgery, but sometimes the OR schedule or the surgeon's schedule prevents [place] from making those  accommodations.  If you have not heard from our office [phone]) in 5 working days, call the office and ask for your surgeon's nurse.  If you have other questions about your diagnosis, plan, or surgery, call the office and ask for your surgeon's nurse.  Pt Education - [facility] Breast Biopsy HCI: discussed with patient and provided information. The anatomy and the physiology was discussed. The pathophysiology and natural history of the disease was discussed. Options were discussed and  recommendations were made. Technique, risks, benefits, & alternatives were discussed. Risks such as stroke, heart attack, bleeding, indection, death, and other risks discussed. Questions answered. The patient agrees to proceed.

## 2015-06-21 NOTE — Interval H&P Note (Signed)
History and Physical Interval Note:  06/21/2015 8:12 AM  Lisa Alexander  has presented today for surgery, with the diagnosis of RIGHT BREAST MASS  The various methods of treatment have been discussed with the patient and family. After consideration of risks, benefits and other options for treatment, the patient has consented to  Procedure(s): BREAST LUMPECTOMY WITH RADIOACTIVE SEED LOCALIZATION (Right) as a surgical intervention .  The patient's history has been reviewed, patient examined, no change in status, stable for surgery.  I have reviewed the patient's chart and labs.  Questions were answered to the patient's satisfaction.     [name]

## 2015-06-21 NOTE — Anesthesia Postprocedure Evaluation (Signed)
Anesthesia Post Note  Patient: Lisa Alexander  Procedure(s) Performed: Procedure(s) (LRB): BREAST LUMPECTOMY WITH RADIOACTIVE SEED LOCALIZATION (Right)  Patient location during evaluation: PACU Anesthesia Type: General Level of consciousness: awake and alert Pain management: pain level controlled Vital Signs Assessment: post-procedure vital signs reviewed and stable Respiratory status: spontaneous breathing, nonlabored ventilation and respiratory function stable Cardiovascular status: blood pressure returned to baseline and stable Postop Assessment: no signs of nausea or vomiting Anesthetic complications: no    Last Vitals:  Filed Vitals:   06/21/15 1130 06/21/15 1155  BP: 136/69 142/64  Pulse: 93 93  Temp:  36.6 C  Resp: 22 18    Last Pain:  Filed Vitals:   06/21/15 1157  PainSc: 0-No pain                 Nesta Scaturro A

## 2015-06-21 NOTE — Anesthesia Procedure Notes (Addendum)
Procedure Name: LMA Insertion Date/Time: 06/21/2015 8:38 AM Performed by: Norva Pavlov Pre-anesthesia Checklist: Patient identified, Emergency Drugs available, Suction available and Patient being monitored Patient Re-evaluated:Patient Re-evaluated prior to inductionOxygen Delivery Method: Circle System Utilized Preoxygenation: Pre-oxygenation with 100% oxygen Intubation Type: IV induction Ventilation: Mask ventilation without difficulty LMA: LMA inserted LMA Size: 4.0 Number of attempts: 1 Airway Equipment and Method: bite block Placement Confirmation: positive ETCO2 Tube secured with: Tape Dental Injury: Teeth and Oropharynx as per pre-operative assessment    Date/Time: 06/21/2015 9:00 AM Performed by: Norva Pavlov Preoxygenation: Pre-oxygenation with 100% oxygen Intubation Type: IV induction Ventilation: Mask ventilation without difficulty and Oral airway inserted - appropriate to patient size Laryngoscope Size: Mac and 3 Grade View: Grade III Tube type: Oral Tube size: 7.0 mm Number of attempts: 1 Placement Confirmation: ETT inserted through vocal cords under direct vision,  positive ETCO2 and breath sounds checked- equal and bilateral Secured at: 21 cm Tube secured with: Tape Dental Injury: Teeth and Oropharynx as per pre-operative assessment  Comments: Patient coughed on LMA, O2 sat down to 50%, LMA removed, hand ventilated with oral airway. Suctioned, AOI by Dr. Ivin Booty. VSS. O2 sat to 100%.

## 2015-06-21 NOTE — Op Note (Signed)
Preoperative diagnosis: Right breast complex sclerosing lesion  Postoperative diagnosis: Same  Procedure: Right breast seed localized partial mastectomy  Surgeon: Erroll Luna M.D.  Anesthesia: Gen. endotracheal anesthesia with 0.25% Sensorcaine with epinephrine  EBL: Minimal  Specimen: Right breast tissue with localizing clip and seed within the specimen verified by x-ray  Drains: None  Indications for procedure: The patient is 75 year old female who was found to have a mammographic abnormality in her right breast. She underwent core biopsy which showed a complex sclerosing lesion. She was sent for surgical evaluation. We discussed options of observation versus removal. We discussed the small but finite risk of occult ligament see with these lesions on core biopsy. The pros and cons of biopsy were discussed as well as the pros and cons of surgical excision. After discussion of the above she wished to proceed with right breast partial mastectomy with removal of complex sclerosing lesion.The procedure has been discussed with the patient. Alternatives to surgery have been discussed with the patient.  Risks of surgery include bleeding,  Infection,  Seroma formation, death,  and the need for further surgery.   The patient understands and wishes to proceed.  Description of procedure: The patient was met in the holding area and questions were answered. The neoprobe was used to verify the proper seed location. He is taken back to the operating room and placed upon the OR table. After induction of general anesthesia, the right breast is prepped and draped in sterile fashion. Received antibiotics and timeout was done. Neoprobe was used to localize the seed in the right upper outer quadrant of her breast. Curvilinear incision was made over this area and dissection was carried down to encompass the seen in the clip and the tissue surrounding. The area was removed with gross negative margins.  Hemostasis  achieved. Specimen revealed both the clip and is seen within the specimen. Cavity is made hemostatic and closed with 3-0 Vicryl and 4-0 Monocryl. Liquid adhesive applied. All final counts found to be correct. Patient was extubated taken to recovery in satisfactory condition.

## 2015-06-22 ENCOUNTER — Encounter (HOSPITAL_BASED_OUTPATIENT_CLINIC_OR_DEPARTMENT_OTHER): Payer: Self-pay | Admitting: Surgery

## 2015-11-22 ENCOUNTER — Other Ambulatory Visit: Payer: Self-pay | Admitting: Internal Medicine

## 2015-11-22 DIAGNOSIS — E2839 Other primary ovarian failure: Secondary | ICD-10-CM

## 2017-09-25 IMAGING — MG MM DIAG BREAST TOMO UNI RIGHT
4 series · 4 of 12 positions shown · non-contrast
Comparison: Previous exam(s).

CLINICAL DATA: Post right breast tomosynthesis guided biopsy.

EXAM:
DIAGNOSTIC RIGHT MAMMOGRAM POST STEREOTACTIC/TOMOSYNTHESIS GUIDED
BIOPSY

[R CC]
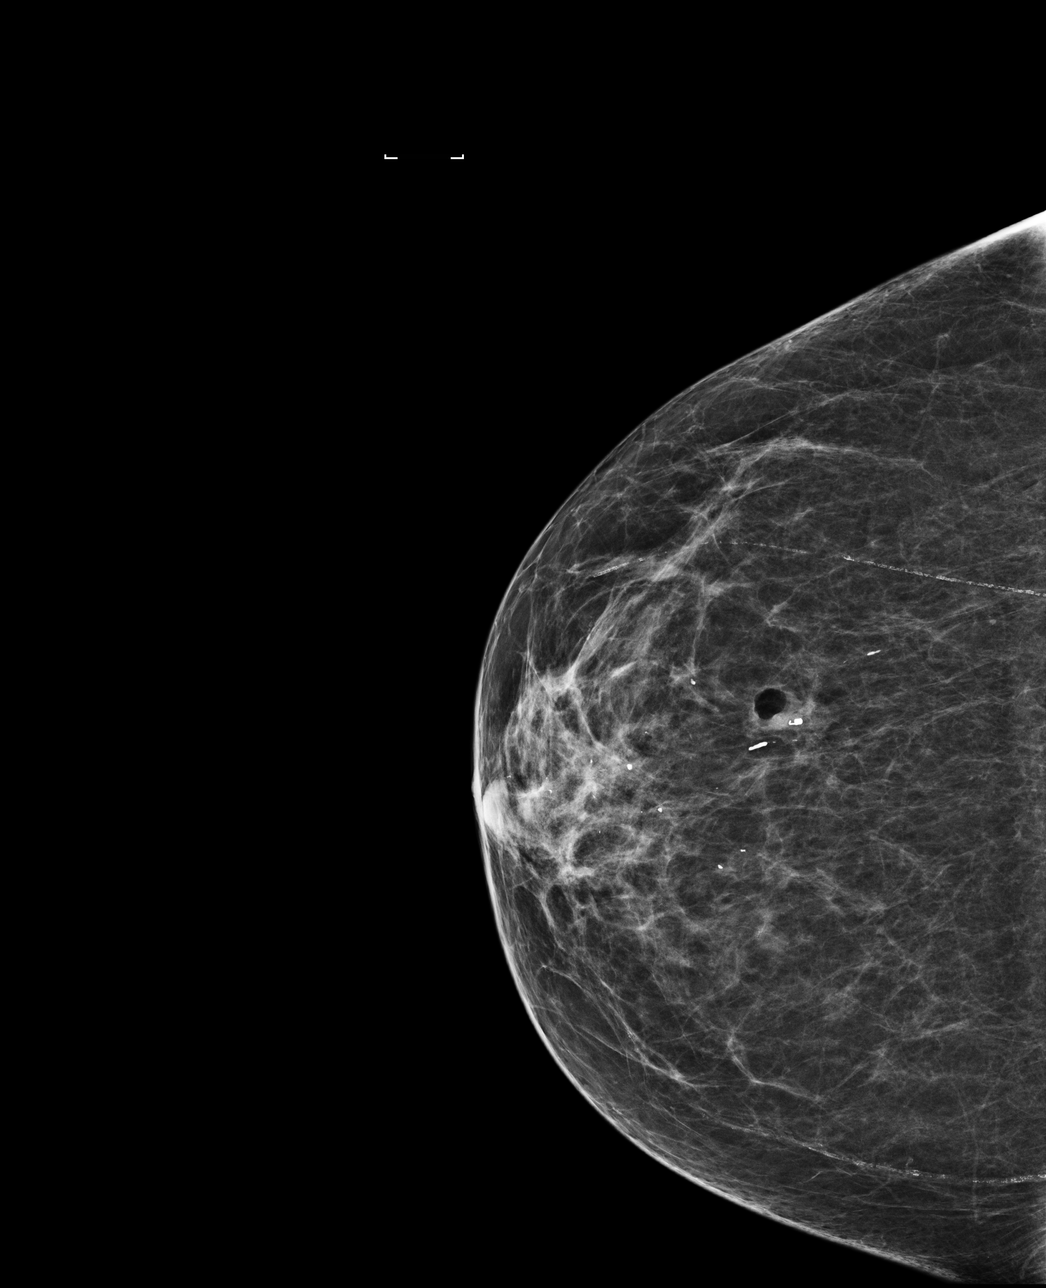

[R ML]
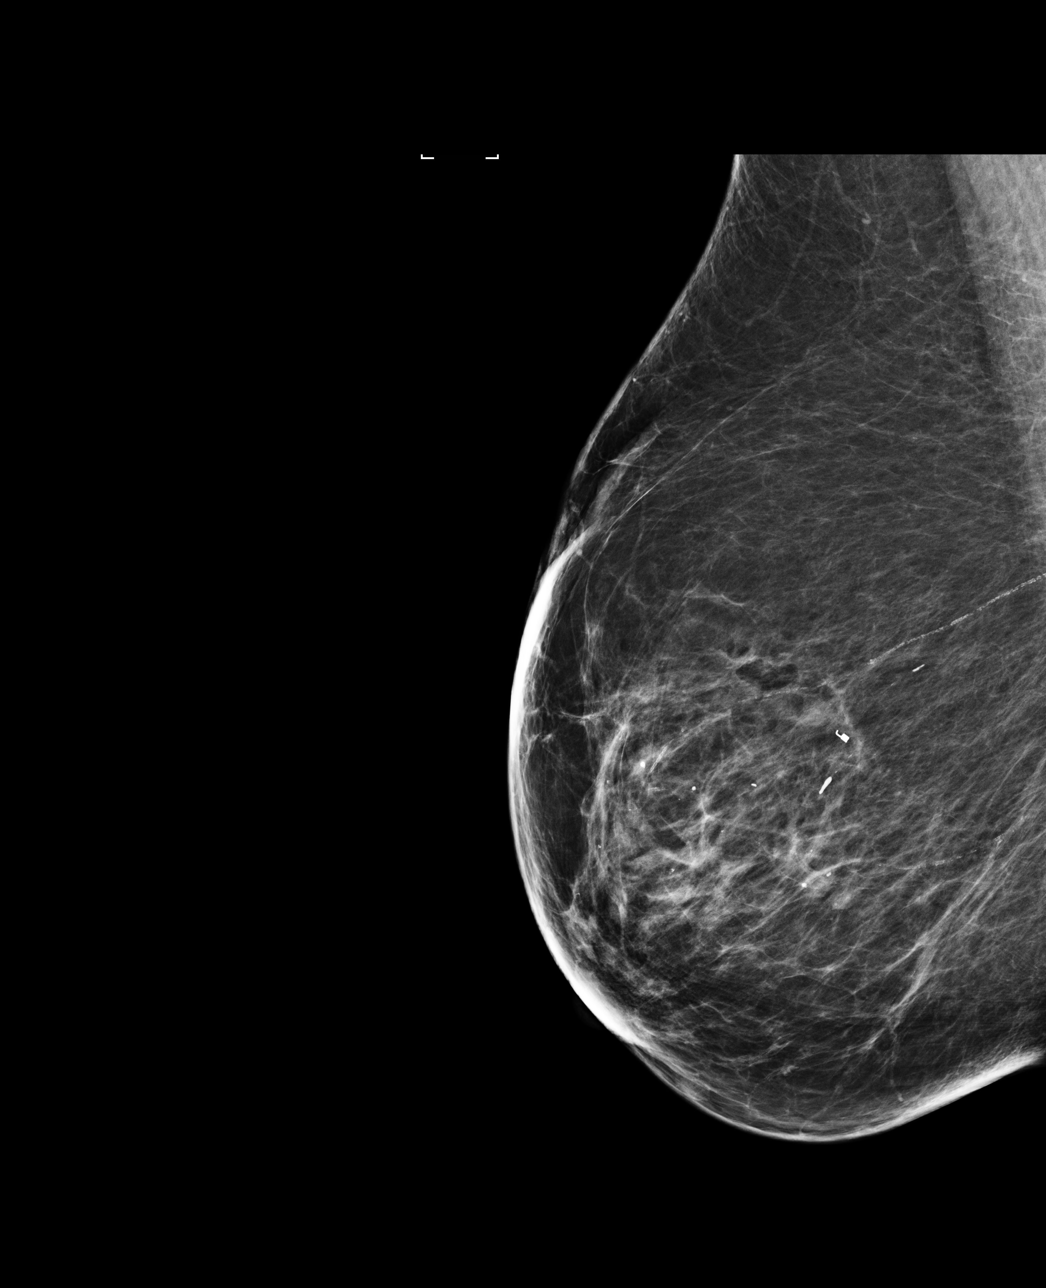

[R CC tomo · tomo slice 37/73.0]
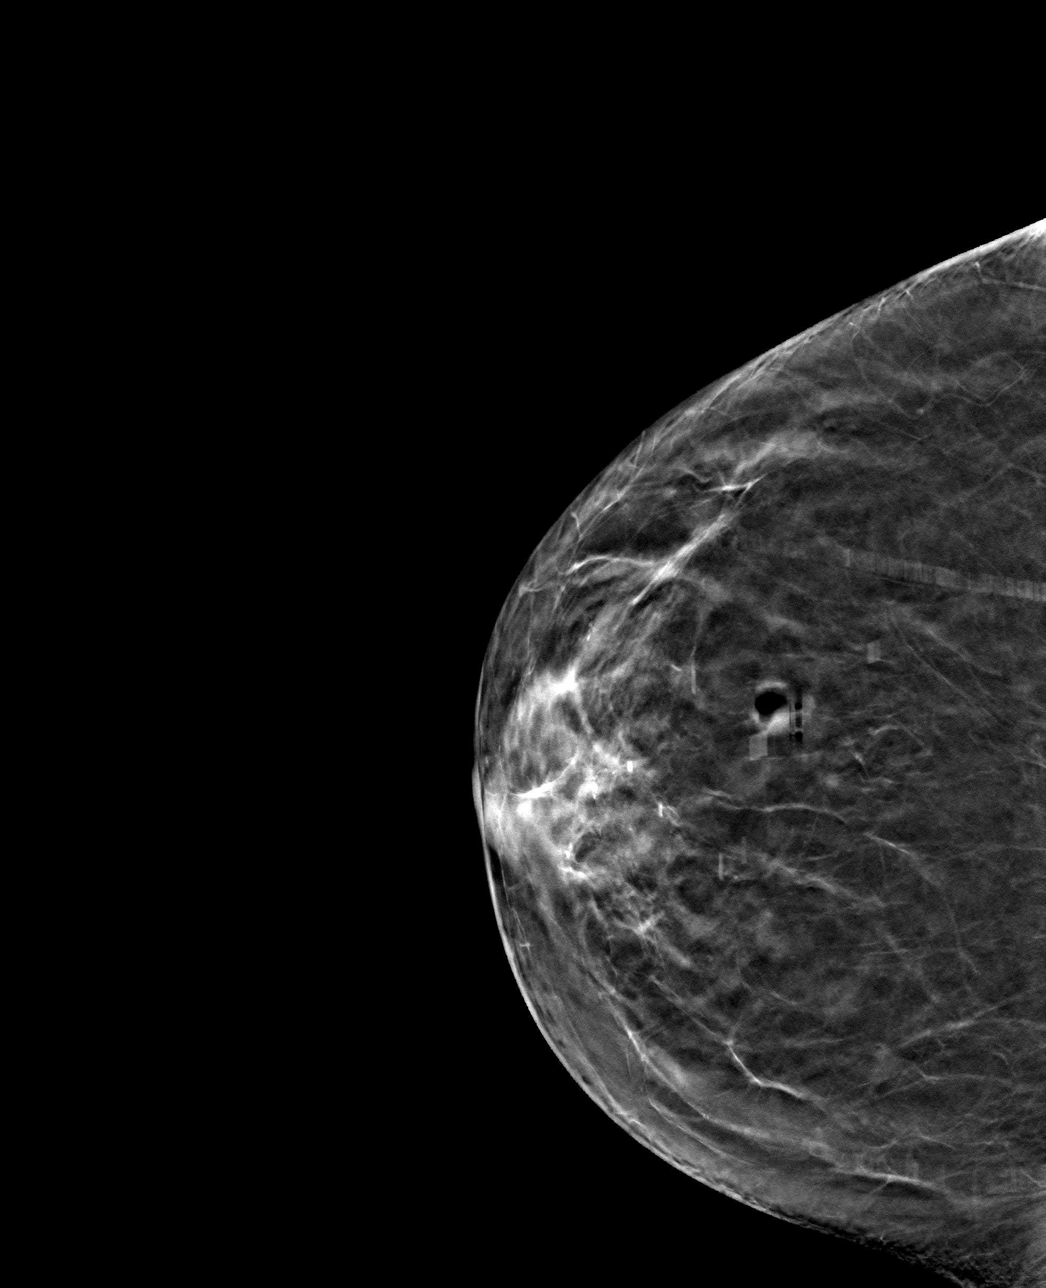

[R ML tomo · tomo slice 44/87.0]
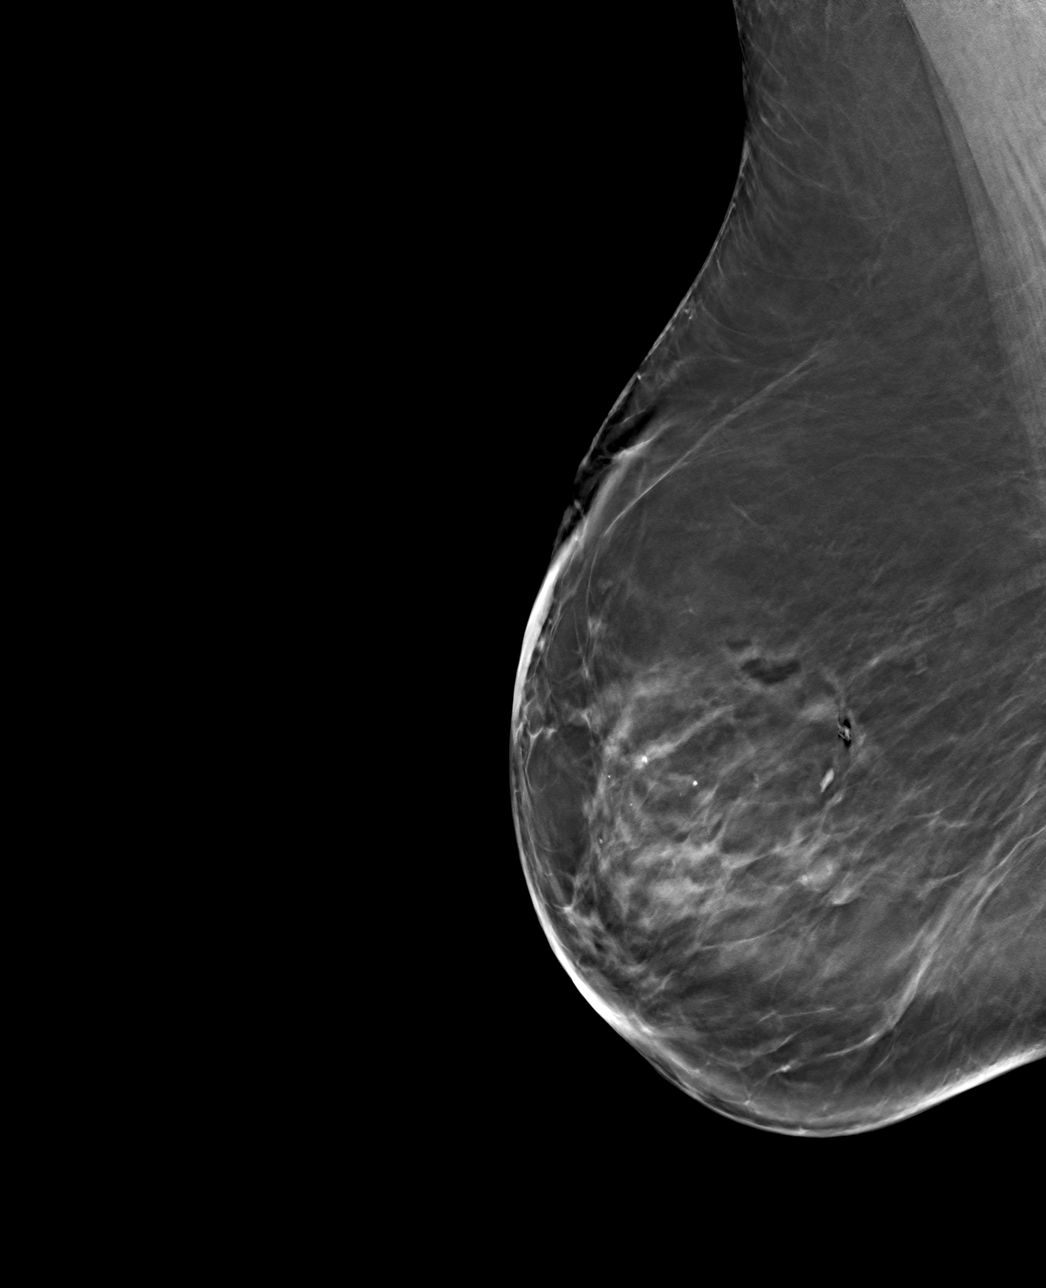

[4 of 12 positions shown; findings below may reference images not displayed]

FINDINGS: Mammographic images were obtained following
stereotactic/tomosynthesis guided biopsy of the area of distortion
located within the right breast superiorly (upper-outer quadrant).
The coil shaped clip is in appropriate position.
IMPRESSION: Appropriate position of clip following right breast tomosynthesis
guided biopsy.

Final Assessment: Post Procedure Mammograms for Marker Placement

## 2018-06-30 DIAGNOSIS — I160 Hypertensive urgency: Secondary | ICD-10-CM | POA: Insufficient documentation

## 2018-06-30 DIAGNOSIS — N3000 Acute cystitis without hematuria: Secondary | ICD-10-CM | POA: Insufficient documentation

## 2018-06-30 DIAGNOSIS — R112 Nausea with vomiting, unspecified: Secondary | ICD-10-CM | POA: Insufficient documentation

## 2018-07-02 DIAGNOSIS — K8020 Calculus of gallbladder without cholecystitis without obstruction: Secondary | ICD-10-CM | POA: Insufficient documentation

## 2018-07-02 DIAGNOSIS — E871 Hypo-osmolality and hyponatremia: Secondary | ICD-10-CM | POA: Insufficient documentation

## 2018-07-07 DIAGNOSIS — N179 Acute kidney failure, unspecified: Secondary | ICD-10-CM | POA: Insufficient documentation

## 2020-04-12 DIAGNOSIS — I10 Essential (primary) hypertension: Secondary | ICD-10-CM | POA: Insufficient documentation

## 2020-04-12 DIAGNOSIS — M1711 Unilateral primary osteoarthritis, right knee: Secondary | ICD-10-CM | POA: Insufficient documentation

## 2020-04-12 DIAGNOSIS — E785 Hyperlipidemia, unspecified: Secondary | ICD-10-CM | POA: Insufficient documentation

## 2020-07-06 DIAGNOSIS — E1142 Type 2 diabetes mellitus with diabetic polyneuropathy: Secondary | ICD-10-CM | POA: Insufficient documentation

## 2020-07-18 ENCOUNTER — Other Ambulatory Visit: Payer: Self-pay

## 2020-07-18 ENCOUNTER — Ambulatory Visit (INDEPENDENT_AMBULATORY_CARE_PROVIDER_SITE_OTHER): Payer: Medicare Other | Admitting: Podiatry

## 2020-07-18 ENCOUNTER — Encounter: Payer: Self-pay | Admitting: Podiatry

## 2020-07-18 DIAGNOSIS — M2042 Other hammer toe(s) (acquired), left foot: Secondary | ICD-10-CM

## 2020-07-18 DIAGNOSIS — M2041 Other hammer toe(s) (acquired), right foot: Secondary | ICD-10-CM | POA: Diagnosis not present

## 2020-07-18 DIAGNOSIS — B351 Tinea unguium: Secondary | ICD-10-CM | POA: Diagnosis not present

## 2020-07-18 DIAGNOSIS — D649 Anemia, unspecified: Secondary | ICD-10-CM | POA: Insufficient documentation

## 2020-07-18 DIAGNOSIS — M1990 Unspecified osteoarthritis, unspecified site: Secondary | ICD-10-CM | POA: Insufficient documentation

## 2020-07-18 DIAGNOSIS — E1142 Type 2 diabetes mellitus with diabetic polyneuropathy: Secondary | ICD-10-CM

## 2020-07-18 DIAGNOSIS — M79674 Pain in right toe(s): Secondary | ICD-10-CM | POA: Diagnosis not present

## 2020-07-18 DIAGNOSIS — E669 Obesity, unspecified: Secondary | ICD-10-CM | POA: Insufficient documentation

## 2020-07-18 DIAGNOSIS — J309 Allergic rhinitis, unspecified: Secondary | ICD-10-CM | POA: Insufficient documentation

## 2020-07-18 DIAGNOSIS — L601 Onycholysis: Secondary | ICD-10-CM

## 2020-07-18 DIAGNOSIS — R1310 Dysphagia, unspecified: Secondary | ICD-10-CM | POA: Insufficient documentation

## 2020-07-18 DIAGNOSIS — M79675 Pain in left toe(s): Secondary | ICD-10-CM | POA: Diagnosis not present

## 2020-07-18 DIAGNOSIS — K5900 Constipation, unspecified: Secondary | ICD-10-CM | POA: Insufficient documentation

## 2020-07-18 DIAGNOSIS — N3281 Overactive bladder: Secondary | ICD-10-CM | POA: Insufficient documentation

## 2020-07-18 DIAGNOSIS — E119 Type 2 diabetes mellitus without complications: Secondary | ICD-10-CM

## 2020-07-18 DIAGNOSIS — I10 Essential (primary) hypertension: Secondary | ICD-10-CM | POA: Insufficient documentation
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
## 2020-07-18 NOTE — Patient Instructions (Addendum)
Ciclopirox nail solution What is this medicine? CICLOPIROX (sye kloe PEER ox) NAIL SOLUTION is an antifungal medicine. It used to treat fungal infections of the nails. This medicine may be used for other purposes; ask your health care provider or pharmacist if you have questions. COMMON BRAND NAME(S): CNL8, Penlac What should I tell my health care provider before I take this medicine? They need to know if you have any of these conditions:  diabetes mellitus  history of seizures  HIV infection  immune system problems or organ transplant  large areas of burned or damaged skin  peripheral vascular disease or poor circulation  taking corticosteroid medication (including steroid inhalers, cream, or lotion)  an unusual or allergic reaction to ciclopirox, isopropyl alcohol, other medicines, foods, dyes, or preservatives  pregnant or trying to get pregnant  breast-feeding How should I use this medicine? This medicine is for external use only. Follow the directions that come with this medicine exactly. Wash and dry your hands before use. Avoid contact with the eyes, mouth or nose. If you do get this medicine in your eyes, rinse out with plenty of cool tap water. Contact your doctor or health care professional if eye irritation occurs. Use at regular intervals. Do not use your medicine more often than directed. Finish the full course prescribed by your doctor or health care professional even if you think you are better. Do not stop using except on your doctor's advice. Talk to your pediatrician regarding the use of this medicine in children. While this medicine may be prescribed for children as young as 12 years for selected conditions, precautions do apply. Overdosage: If you think you have taken too much of this medicine contact a poison control center or emergency room at once. NOTE: This medicine is only for you. Do not share this medicine with others. What if I miss a dose? If you  miss a dose, use it as soon as you can. If it is almost time for your next dose, use only that dose. Do not use double or extra doses. What may interact with this medicine? Interactions are not expected. Do not use any other skin products without telling your doctor or health care professional. This list may not describe all possible interactions. Give your health care provider a list of all the medicines, herbs, non-prescription drugs, or dietary supplements you use. Also tell them if you smoke, drink alcohol, or use illegal drugs. Some items may interact with your medicine. What should I watch for while using this medicine? Tell your doctor or health care professional if your symptoms get worse. Four to six months of treatment may be needed for the nail(s) to improve. Some people may not achieve a complete cure or clearing of the nails by this time. Tell your doctor or health care professional if you develop sores or blisters that do not heal properly. If your nail infection returns after stopping using this product, contact your doctor or health care professional. What side effects may I notice from receiving this medicine? Side effects that you should report to your doctor or health care professional as soon as possible:  allergic reactions like skin rash, itching or hives, swelling of the face, lips, or tongue  severe irritation, redness, burning, blistering, peeling, swelling, oozing Side effects that usually do not require medical attention (report to your doctor or health care professional if they continue or are bothersome):  mild reddening of the skin  nail discoloration  temporary burning or  mild stinging at the site of application This list may not describe all possible side effects. Call your doctor for medical advice about side effects. You may report side effects to FDA at 1-800-FDA-1088. Where should I keep my medicine? Keep out of the reach of children. Store at room  temperature between 15 and 30 degrees C (59 and 86 degrees F). Do not freeze. Protect from light by storing the bottle in the carton after every use. This medicine is flammable. Keep away from heat and flame. Throw away any unused medicine after the expiration date. NOTE: This sheet is a summary. It may not cover all possible information. If you have questions about this medicine, talk to your doctor, pharmacist, or health care provider.  2021 Elsevier/Gold Standard (2007-07-14 16:49:20) Do not attempt to trim your nails. You may file them with an emery board should they become too long.  Onychomycosis/Fungal Toenails  WHAT IS IT? An infection that lies within the keratin of your nail plate that is caused by a fungus.  WHY ME? Fungal infections affect all ages, sexes, races, and creeds.  There may be many factors that predispose you to a fungal infection such as age, coexisting medical conditions such as diabetes, or an autoimmune disease; stress, medications, fatigue, genetics, etc.  Bottom line: fungus thrives in a warm, moist environment and your shoes offer such a location.  IS IT CONTAGIOUS? Theoretically, yes.  You do not want to share shoes, nail clippers or files with someone who has fungal toenails.  Walking around barefoot in the same room or sleeping in the same bed is unlikely to transfer the organism.  It is important to realize, however, that fungus can spread easily from one nail to the next on the same foot.  HOW DO WE TREAT THIS?  There are several ways to treat this condition.  Treatment may depend on many factors such as age, medications, pregnancy, liver and kidney conditions, etc.  It is best to ask your doctor which options are available to you.  11. No treatment.   Unlike many other medical concerns, you can live with this condition.  However for many people this can be a painful condition and may lead to ingrown toenails or a bacterial infection.  It is recommended that you  keep the nails cut short to help reduce the amount of fungal nail. 12. Topical treatment.  These range from herbal remedies to prescription strength nail lacquers.  About 40-50% effective, topicals require twice daily application for approximately 9 to 12 months or until an entirely new nail has grown out.  The most effective topicals are medical grade medications available through physicians offices. 13. Oral antifungal medications.  With an 80-90% cure rate, the most common oral medication requires 3 to 4 months of therapy and stays in your system for a year as the new nail grows out.  Oral antifungal medications do require blood work to make sure it is a safe drug for you.  A liver function panel will be performed prior to starting the medication and after the first month of treatment.  It is important to have the blood work performed to avoid any harmful side effects.  In general, this medication safe but blood work is required. 14. Laser Therapy.  This treatment is performed by applying a specialized laser to the affected nail plate.  This therapy is noninvasive, fast, and non-painful.  It is not covered by insurance and is therefore, out of pocket.  The  results have been very good with a 80-95% cure rate.  [facility] is the only practice in the area to offer this therapy. 15. Permanent Nail Avulsion.  Removing the entire nail so that a new nail will not grow back.  Diabetes Mellitus and Foot Care Foot care is an important part of your health, especially when you have diabetes. Diabetes may cause you to have problems because of poor blood flow (circulation) to your feet and legs, which can cause your skin to:  Become thinner and drier.  Break more easily.  Heal more slowly.  Peel and crack. You may also have nerve damage (neuropathy) in your legs and feet, causing decreased feeling in them. This means that you may not notice minor injuries to your feet that could lead to more serious  problems. Noticing and addressing any potential problems early is the best way to prevent future foot problems. How to care for your feet Foot hygiene  Wash your feet daily with warm water and mild soap. Do not use hot water. Then, pat your feet and the areas between your toes until they are completely dry. Do not soak your feet as this can dry your skin.  Apply a moisturizing lotion or petroleum jelly to the skin on your feet and to dry, brittle toenails. Use lotion that does not contain alcohol and is unscented. Do not apply lotion between your toes.   Shoes and socks  Wear clean socks or stockings every day. Make sure they are not too tight. Do not wear knee-high stockings since they may decrease blood flow to your legs.  Wear shoes that fit properly and have enough cushioning. Always look in your shoes before you put them on to be sure there are no objects inside.  To break in new shoes, wear them for just a few hours a day. This prevents injuries on your feet. Wounds, scrapes, corns, and calluses  Check your feet daily for blisters, cuts, bruises, sores, and redness. If you cannot see the bottom of your feet, use a mirror or ask someone for help.  Do not cut corns or calluses or try to remove them with medicine.  If you find a minor scrape, cut, or break in the skin on your feet, keep it and the skin around it clean and dry. You may clean these areas with mild soap and water. Do not clean the area with peroxide, alcohol, or iodine.  If you have a wound, scrape, corn, or callus on your foot, look at it several times a day to make sure it is healing and not infected. Check for: ? Redness, swelling, or pain. ? Fluid or blood. ? Warmth. ? Pus or a bad smell.   General tips  Do not cross your legs. This may decrease blood flow to your feet.  Do not use heating pads or hot water bottles on your feet. They may burn your skin. If you have lost feeling in your feet or legs, you may not  know this is happening until it is too late.  Protect your feet from hot and cold by wearing shoes, such as at the beach or on hot pavement.  Schedule a complete foot exam at least once a year (annually) or more often if you have foot problems. Report any cuts, sores, or bruises to your health care provider immediately. Where to find more information  American Diabetes Association: www.diabetes.org  Association of Diabetes Care & Education Specialists: www.diabeteseducator.org Contact  a health care provider if:  You have a medical condition that increases your risk of infection and you have any cuts, sores, or bruises on your feet.  You have an injury that is not healing.  You have redness on your legs or feet.  You feel burning or tingling in your legs or feet.  You have pain or cramps in your legs and feet.  Your legs or feet are numb.  Your feet always feel cold.  You have pain around any toenails. Get help right away if:  You have a wound, scrape, corn, or callus on your foot and: ? You have pain, swelling, or redness that gets worse. ? You have fluid or blood coming from the wound, scrape, corn, or callus. ? Your wound, scrape, corn, or callus feels warm to the touch. ? You have pus or a bad smell coming from the wound, scrape, corn, or callus. ? You have a fever. ? You have a red line going up your leg. Summary  Check your feet every day for blisters, cuts, bruises, sores, and redness.  Apply a moisturizing lotion or petroleum jelly to the skin on your feet and to dry, brittle toenails.  Wear shoes that fit properly and have enough cushioning.  If you have foot problems, report any cuts, sores, or bruises to your health care provider immediately.  Schedule a complete foot exam at least once a year (annually) or more often if you have foot problems. This information is not intended to replace advice given to you by your health care provider. Make sure you discuss  any questions you have with your health care provider. Document Revised: 10/29/2019 Document Reviewed: 10/29/2019 Elsevier Patient Education  2021 [employer].

## 2020-10-31 ENCOUNTER — Other Ambulatory Visit: Payer: Self-pay

## 2020-10-31 ENCOUNTER — Encounter: Payer: Self-pay | Admitting: Podiatry

## 2020-10-31 ENCOUNTER — Ambulatory Visit (INDEPENDENT_AMBULATORY_CARE_PROVIDER_SITE_OTHER): Payer: Medicare Other | Admitting: Podiatry

## 2020-10-31 DIAGNOSIS — B351 Tinea unguium: Secondary | ICD-10-CM | POA: Diagnosis not present

## 2020-10-31 DIAGNOSIS — M79674 Pain in right toe(s): Secondary | ICD-10-CM

## 2020-10-31 DIAGNOSIS — M79675 Pain in left toe(s): Secondary | ICD-10-CM

## 2020-11-03 NOTE — Progress Notes (Signed)
Subjective: Lisa Alexander is a pleasant 80 y.o. female patient seen today for at risk diabetic foot care. She has painful thick toenails that are difficult to trim. Pain interferes with ambulation. Aggravating factors include wearing enclosed shoe gear. Pain is relieved with periodic professional debridement. She continues to use Penlac Nail Lacquer on toenails daily.  PCP is [name], MD. Last visit was: 07/06/2020.  Allergies  Allergen Reactions   Codeine Nausea And Vomiting    Other reaction(s): Dizziness, GI intolerance, Vomiting   Benzyl Alcohol     Other reaction(s): Other (See Comments) Other reaction(s): Other (See Comments) Sedation Sedation    Promethazine Other (See Comments)    Other reaction(s): Other Sedation    Objective: Physical Exam  General: Lisa Alexander is a pleasant 80 y.o. African American female, in NAD. AAO x 3.   Vascular:  Capillary refill time to digits immediate b/l. Faintly palpable pedal pulses b/l. Pedal hair absent. Lower extremity skin temperature gradient within normal limits. No edema noted b/l lower extremities.  Dermatological:  Pedal skin with normal turgor, texture and tone b/l lower extremities No open wounds b/l lower extremities No interdigital macerations b/l lower extremities Toenails 1-5 b/l elongated, discolored, dystrophic, thickened, crumbly with subungual debris and tenderness to dorsal palpation.  Musculoskeletal:  Normal muscle strength 5/5 to all lower extremity muscle groups bilaterally. No pain crepitus or joint limitation noted with ROM b/l. Hammertoe(s) noted to the L 5th toe and R 5th toe.  Neurological:  Pt has subjective symptoms of neuropathy. Protective sensation intact 5/5 intact bilaterally with 10g monofilament b/l. Vibratory sensation intact b/l. Proprioception intact bilaterally.  Assessment and Plan:  1. Pain due to onychomycosis of toenails of both feet     -Examined patient. -Patient to continue  soft, supportive shoe gear daily. -Toenails 1-5 b/l were debrided in length and girth with sterile nail nippers and dremel without iatrogenic bleeding. Continue Penlac nail lacquer daily. -Patient to report any pedal injuries to medical professional immediately. -Patient/POA to call should there be question/concern in the interim.  Return in about 3 months (around 01/31/2021).  Freddie Breech, DPM

## 2021-01-31 ENCOUNTER — Encounter: Payer: Self-pay | Admitting: Podiatry

## 2021-01-31 ENCOUNTER — Ambulatory Visit (INDEPENDENT_AMBULATORY_CARE_PROVIDER_SITE_OTHER): Payer: Medicare Other | Admitting: Podiatry

## 2021-01-31 ENCOUNTER — Other Ambulatory Visit: Payer: Self-pay

## 2021-01-31 DIAGNOSIS — E559 Vitamin D deficiency, unspecified: Secondary | ICD-10-CM | POA: Insufficient documentation

## 2021-01-31 DIAGNOSIS — L853 Xerosis cutis: Secondary | ICD-10-CM

## 2021-01-31 DIAGNOSIS — B351 Tinea unguium: Secondary | ICD-10-CM

## 2021-01-31 DIAGNOSIS — M79675 Pain in left toe(s): Secondary | ICD-10-CM

## 2021-01-31 DIAGNOSIS — M79674 Pain in right toe(s): Secondary | ICD-10-CM | POA: Diagnosis not present

## 2021-01-31 DIAGNOSIS — K219 Gastro-esophageal reflux disease without esophagitis: Secondary | ICD-10-CM | POA: Insufficient documentation

## 2021-01-31 NOTE — Patient Instructions (Signed)
For extremely dry, cracked feet: moisturize feet once daily; do not apply between toes A. CeraVe Healing Ointment B. Aquaphor Healing Ointment C. Vaseline Petroleum Healing Jelly   If you have problems reaching your feet: apply to feet once daily; do not apply between toes A.  Aquaphor Advanced Therapy Ointment Body Spray B.  Vaseline Intensive Care Spray Lotion Advanced Repair   

## 2021-02-04 NOTE — Progress Notes (Signed)
  Subjective:  Patient ID: Lisa Alexander, female    DOB: February 04, 1941,  MRN: [id number]  Lisa Alexander presents to clinic today for at risk foot care with history of diabetic neuropathy and thick, elongated toenails b/l feet which are tender when wearing enclosed shoe gear.  Patient states blood glucose was 120 mg/dl today.    PCP is [name], MD , and last visit was 12/12/2020.  Allergies  Allergen Reactions   Codeine Nausea And Vomiting    Other reaction(s): Dizziness, GI intolerance, Vomiting   Benzyl Alcohol     Other reaction(s): Other (See Comments) Other reaction(s): Other (See Comments) Sedation Sedation    Promethazine Other (See Comments)    Other reaction(s): Other Sedation    Review of Systems: Negative except as noted in the HPI. Objective:   Constitutional Lisa Alexander is a pleasant 80 y.o. African American female, WD, WN in NAD. AAO x 3.   Vascular Capillary refill time to digits immediate b/l. Faintly palpable pedal pulses b/l. Pedal hair absent. Lower extremity skin temperature gradient within normal limits. No pain with calf compression b/l. No edema noted b/l lower extremities. No cyanosis or clubbing noted.  Neurologic Normal speech. Oriented to person, place, and time. Pt has subjective symptoms of neuropathy. Protective sensation intact 5/5 intact bilaterally with 10g monofilament b/l. Vibratory sensation intact b/l.  Dermatologic Pedal skin with normal turgor, texture and tone b/l lower extremities. No open wounds b/l LE. No interdigital macerations b/l lower extremities. Toenails 1-5 b/l elongated, discolored, dystrophic, thickened, crumbly with subungual debris and tenderness to dorsal palpation. Pedal skin noted to be dry b/l feet.  Orthopedic: Normal muscle strength 5/5 to all lower extremity muscle groups bilaterally. Hammertoe(s) noted to the L 5th toe and R 5th toe.   Radiographs: None Assessment:   1. Pain due to onychomycosis of toenails of both  feet   2. Xerosis cutis    Plan:  Patient was evaluated and treated and all questions answered. Consent given for treatment as described below: -Examined patient. -Continue diabetic foot care principles: inspect feet daily, monitor glucose as recommended by PCP and/or Endocrinologist, and follow prescribed diet per PCP, Endocrinologist and/or dietician. -Patient to continue soft, supportive shoe gear daily. -Toenails 1-5 b/l were debrided in length and girth with sterile nail nippers and dremel without iatrogenic bleeding.  -Patient to report any pedal injuries to medical professional immediately. -For dry skin, patient was given written list of OTC moisturizers. Patient/POA instructed to apply to foot/feet once daily avoiding application between toes.  -Patient/POA to call should there be question/concern in the interim.  Return in about 3 months (around 05/03/2021).  Freddie Breech, DPM

## 2021-04-27 ENCOUNTER — Other Ambulatory Visit: Payer: Self-pay | Admitting: Gastroenterology

## 2021-05-01 ENCOUNTER — Other Ambulatory Visit (HOSPITAL_COMMUNITY): Payer: Self-pay

## 2021-05-04 NOTE — Anesthesia Preprocedure Evaluation (Addendum)
Anesthesia Evaluation  Patient identified by MRN, date of birth, ID band Patient awake    Reviewed: Allergy & Precautions, NPO status , Patient's Chart, lab work & pertinent test results  History of Anesthesia Complications Negative for: history of anesthetic complications  Airway Mallampati: II  TM Distance: >3 FB Neck ROM: Full    Dental no notable dental hx. (+) Dental Advisory Given   Pulmonary neg pulmonary ROS,    Pulmonary exam normal        Cardiovascular hypertension, Pt. on medications Normal cardiovascular exam     Neuro/Psych negative neurological ROS     GI/Hepatic Neg liver ROS, GERD  Medicated,  Endo/Other  diabetes  Renal/GU negative Renal ROS     Musculoskeletal negative musculoskeletal ROS (+)   Abdominal   Peds  Hematology negative hematology ROS (+)   Anesthesia Other Findings   Reproductive/Obstetrics                            Anesthesia Physical Anesthesia Plan  ASA: 2  Anesthesia Plan: MAC   Post-op Pain Management:    Induction:   PONV Risk Score and Plan: Propofol infusion and Treatment may vary due to age or medical condition  Airway Management Planned: Natural Airway  Additional Equipment:   Intra-op Plan:   Post-operative Plan:   Informed Consent: I have reviewed the patients History and Physical, chart, labs and discussed the procedure including the risks, benefits and alternatives for the proposed anesthesia with the patient or authorized representative who has indicated his/her understanding and acceptance.     Dental advisory given  Plan Discussed with: CRNA and Anesthesiologist  Anesthesia Plan Comments:         Anesthesia Quick Evaluation

## 2021-05-05 ENCOUNTER — Encounter (HOSPITAL_COMMUNITY): Payer: Self-pay | Admitting: Gastroenterology

## 2021-05-05 ENCOUNTER — Ambulatory Visit (HOSPITAL_COMMUNITY): Payer: Medicare Other | Admitting: Anesthesiology

## 2021-05-05 ENCOUNTER — Other Ambulatory Visit: Payer: Self-pay

## 2021-05-05 ENCOUNTER — Ambulatory Visit (HOSPITAL_COMMUNITY)
Admission: RE | Admit: 2021-05-05 | Discharge: 2021-05-05 | Disposition: A | Discharge status: Home / Self Care | Payer: Medicare Other | Source: Ambulatory Visit | Attending: Gastroenterology | Admitting: Gastroenterology

## 2021-05-05 ENCOUNTER — Encounter (HOSPITAL_COMMUNITY)
Admission: RE | Disposition: A | Discharge status: Home / Self Care | Payer: Self-pay | Source: Ambulatory Visit | Attending: Gastroenterology

## 2021-05-05 DIAGNOSIS — D509 Iron deficiency anemia, unspecified: Secondary | ICD-10-CM | POA: Insufficient documentation

## 2021-05-05 DIAGNOSIS — K219 Gastro-esophageal reflux disease without esophagitis: Secondary | ICD-10-CM | POA: Diagnosis not present

## 2021-05-05 DIAGNOSIS — K5730 Diverticulosis of large intestine without perforation or abscess without bleeding: Secondary | ICD-10-CM | POA: Diagnosis not present

## 2021-05-05 DIAGNOSIS — Z79899 Other long term (current) drug therapy: Secondary | ICD-10-CM | POA: Diagnosis not present

## 2021-05-05 DIAGNOSIS — K31A15 Gastric intestinal metaplasia without dysplasia, involving multiple sites: Secondary | ICD-10-CM | POA: Diagnosis not present

## 2021-05-05 DIAGNOSIS — K2950 Unspecified chronic gastritis without bleeding: Secondary | ICD-10-CM | POA: Insufficient documentation

## 2021-05-05 HISTORY — PX: COLONOSCOPY WITH PROPOFOL: SHX5780

## 2021-05-05 HISTORY — PX: POLYPECTOMY: SHX5525

## 2021-05-05 HISTORY — PX: ESOPHAGOGASTRODUODENOSCOPY (EGD) WITH PROPOFOL: SHX5813

## 2021-05-05 HISTORY — PX: BIOPSY: SHX5522

## 2021-05-05 LAB — GLUCOSE, CAPILLARY: Glucose-Capillary: 120 mg/dL — ABNORMAL HIGH (ref 70–99)

## 2021-05-05 SURGERY — ESOPHAGOGASTRODUODENOSCOPY (EGD) WITH PROPOFOL
Anesthesia: Monitor Anesthesia Care

## 2021-05-05 MED ORDER — PROPOFOL 10 MG/ML IV BOLUS
INTRAVENOUS | Status: DC | PRN
  Administered 2021-05-05: 10 mg via INTRAVENOUS
  Administered 2021-05-05: 20 mg via INTRAVENOUS
  Administered 2021-05-05: 10 mg via INTRAVENOUS

## 2021-05-05 MED ORDER — LIDOCAINE 2% (20 MG/ML) 5 ML SYRINGE
INTRAMUSCULAR | Status: DC | PRN
  Administered 2021-05-05: 80 mg via INTRAVENOUS

## 2021-05-05 MED ORDER — LACTATED RINGERS IV SOLN: INTRAVENOUS | Status: DC

## 2021-05-05 MED ORDER — LACTATED RINGERS IV SOLN
INTRAVENOUS | Status: AC | PRN
  Administered 2021-05-05: 1000 mL via INTRAVENOUS

## 2021-05-05 MED ORDER — PROPOFOL 500 MG/50ML IV EMUL
INTRAVENOUS | Status: AC
  Filled 2021-05-05: qty 50

## 2021-05-05 MED ORDER — PROPOFOL 500 MG/50ML IV EMUL
INTRAVENOUS | Status: DC | PRN
  Administered 2021-05-05: 110 ug/kg/min via INTRAVENOUS

## 2021-05-05 MED ORDER — SODIUM CHLORIDE 0.9 % IV SOLN: INTRAVENOUS | Status: DC

## 2021-05-05 SURGICAL SUPPLY — 25 items

## 2021-05-05 NOTE — Transfer of Care (Signed)
Immediate Anesthesia Transfer of Care Note  Patient: Lisa Alexander  Procedure(s) Performed: ESOPHAGOGASTRODUODENOSCOPY (EGD) WITH PROPOFOL COLONOSCOPY WITH PROPOFOL BIOPSY POLYPECTOMY  Patient Location: PACU  Anesthesia Type:MAC  Level of Consciousness: sedated  Airway & Oxygen Therapy: Patient Spontanous Breathing and Patient connected to face mask oxygen  Post-op Assessment: Report given to RN and Post -op Vital signs reviewed and stable  Post vital signs: Reviewed and stable  Last Vitals:  Vitals Value Taken Time  BP 102/24 05/05/21 0847  Temp    Pulse 64 05/05/21 0849  Resp 17 05/05/21 0849  SpO2 100 % 05/05/21 0849  Vitals shown include unvalidated device data.  Last Pain:  Vitals:   05/05/21 0758  TempSrc: Tympanic  PainSc: 0-No pain         Complications: No notable events documented.

## 2021-05-05 NOTE — Anesthesia Postprocedure Evaluation (Signed)
Anesthesia Post Note  Patient: Lisa Alexander  Procedure(s) Performed: ESOPHAGOGASTRODUODENOSCOPY (EGD) WITH PROPOFOL COLONOSCOPY WITH PROPOFOL BIOPSY POLYPECTOMY     Patient location during evaluation: PACU Anesthesia Type: MAC Level of consciousness: awake and alert Pain management: pain level controlled Vital Signs Assessment: post-procedure vital signs reviewed and stable Respiratory status: spontaneous breathing and respiratory function stable Cardiovascular status: stable Postop Assessment: no apparent nausea or vomiting Anesthetic complications: no   No notable events documented.  Last Vitals:  Vitals:   05/05/21 0907 05/05/21 0920  BP:  (!) 183/68  Pulse: 69 68  Resp: 16 18  Temp:    SpO2: 91% 96%    Last Pain:  Vitals:   05/05/21 0920  TempSrc:   PainSc: 0-No pain                 Keiva Dina DANIEL

## 2021-05-05 NOTE — Op Note (Signed)
[facility] Patient Name: Lisa MoodyJurel Alexander Procedure Date: 05/05/2021 MRN: [id number] Attending MD: Jeani HawkingPatrick Kay Shippy , MD Date of Birth: February 10, 1941 CSN: [id number] Age: 81 Admit Type: Outpatient Procedure:                Upper GI endoscopy Indications:              Iron deficiency anemia Providers:                Jeani HawkingPatrick Hadli Vandemark, MD, Fransisca ConnorsMichael Williams, Priscella MannHaiven Houle,                            Technician Referring MD:              Medicines:                 Complications:            No immediate complications. Estimated Blood Loss:     Estimated blood loss: none. Procedure:                Pre-Anesthesia Assessment:                           - Prior to the procedure, a History and Physical                            was performed, and patient medications and                            allergies were reviewed. The patient's tolerance of                            previous anesthesia was also reviewed. The risks                            and benefits of the procedure and the sedation                            options and risks were discussed with the patient.                            All questions were answered, and informed consent                            was obtained. Prior Anticoagulants: The patient has                            taken no previous anticoagulant or antiplatelet                            agents. ASA Grade Assessment: III - A patient with                            severe systemic disease. After reviewing the risks                            and benefits, the  patient was deemed in                            satisfactory condition to undergo the procedure.                           - Sedation was administered by an anesthesia                            professional. Deep sedation was attained.                           After obtaining informed consent, the endoscope was                            passed under direct vision. Throughout the                             procedure, the patient's blood pressure, pulse, and                            oxygen saturations were monitored continuously. The                            PCF-HQ190L ([id number]) Olympus colonoscope was                            introduced through the mouth, and advanced to the                            second part of duodenum. The upper GI endoscopy was                            accomplished without difficulty. The patient                            tolerated the procedure well. Scope In: Scope Out: Findings:      The esophagus was normal.      Diffuse moderate inflammation characterized by erythema was found in the       gastric body and in the gastric antrum. Biopsies were taken with a cold       forceps for histology.      The examined duodenum was normal. Impression:               - Normal esophagus.                           - Gastritis. Biopsied.                           - Normal examined duodenum. Moderate Sedation:      Not Applicable - Patient had care per Anesthesia. Recommendation:           - Patient has a contact number available for  emergencies. The signs and symptoms of potential                            delayed complications were discussed with the                            patient. Return to normal activities tomorrow.                            Written discharge instructions were provided to the                            patient.                           - Resume previous diet.                           - Continue present medications.                           - Await pathology results. Procedure Code(s):        --- Professional ---                           [phone], Esophagogastroduodenoscopy, flexible,                            transoral; with biopsy, single or multiple Diagnosis Code(s):        --- Professional ---                           K29.70, Gastritis, unspecified, without bleeding                           D50.9,  Iron deficiency anemia, unspecified CPT copyright 2019 American Medical Association. All rights reserved. The codes documented in this report are preliminary and upon coder review may  be revised to meet current compliance requirements. Jeani Hawking, MD Jeani Hawking, MD 05/05/2021 8:47:01 AM This report has been signed electronically. Number of Addenda: 0

## 2021-05-05 NOTE — Op Note (Signed)
[facility] Patient Name: Lisa Alexander Procedure Date: 05/05/2021 MRN: [id number] Attending MD: Jeani Hawking , MD Date of Birth: [birth year] CSN: [id number] Age: 81 Admit Type: Outpatient Procedure:                Colonoscopy Indications:              Iron deficiency anemia Providers:                Jeani Hawking, MD, Fransisca Connors, Priscella Mann,                            Technician Referring MD:              Medicines:                Propofol per Anesthesia Complications:            No immediate complications. Estimated Blood Loss:     Estimated blood loss: none. Procedure:                Pre-Anesthesia Assessment:                           - Prior to the procedure, a History and Physical                            was performed, and patient medications and                            allergies were reviewed. The patient's tolerance of                            previous anesthesia was also reviewed. The risks                            and benefits of the procedure and the sedation                            options and risks were discussed with the patient.                            All questions were answered, and informed consent                            was obtained. Prior Anticoagulants: The patient has                            taken no previous anticoagulant or antiplatelet                            agents. ASA Grade Assessment: III - A patient with                            severe systemic disease. After reviewing the risks                            and benefits, the  patient was deemed in                            satisfactory condition to undergo the procedure.                           - Sedation was administered by an anesthesia                            professional. Deep sedation was attained.                           After obtaining informed consent, the colonoscope                            was passed under direct vision. Throughout the                             procedure, the patient's blood pressure, pulse, and                            oxygen saturations were monitored continuously. The                            PCF-HQ190L ([id number] Olympus colonoscope was                            introduced through the anus and advanced to the the                            terminal ileum. The colonoscopy was performed                            without difficulty. The patient tolerated the                            procedure well. The quality of the bowel                            preparation was evaluated using the BBPS [facility]                            Bowel Preparation Scale) with scores of: Right                            Colon = 3 (entire mucosa seen well with no residual                            staining, small fragments of stool or opaque                            liquid), Transverse Colon = 3 (entire mucosa seen  well with no residual staining, small fragments of                            stool or opaque liquid) and Left Colon = 3 (entire                            mucosa seen well with no residual staining, small                            fragments of stool or opaque liquid). The total                            BBPS score equals 9. The quality of the bowel                            preparation was excellent. The ileocecal valve,                            appendiceal orifice, and rectum were photographed. Scope In: 8:29:53 AM Scope Out: 8:44:22 AM Scope Withdrawal Time: 0 hours 11 minutes 26 seconds  Total Procedure Duration: 0 hours 14 minutes 29 seconds  Findings:      Three sessile polyps were found in the rectum and transverse colon. The       polyps were 2 to 3 mm in size. These polyps were removed with a cold       snare. Resection was complete, but the polyp tissue was only partially       retrieved.      Scattered medium-mouthed diverticula were found in the sigmoid  colon. Impression:               - Three 2 to 3 mm polyps in the rectum and in the                            transverse colon, removed with a cold snare.                            Complete resection. Partial retrieval.                           - Diverticulosis in the sigmoid colon. Moderate Sedation:      Not Applicable - Patient had care per Anesthesia. Recommendation:           - Patient has a contact number available for                            emergencies. The signs and symptoms of potential                            delayed complications were discussed with the                            patient. Return to normal activities tomorrow.  Written discharge instructions were provided to the                            patient.                           - Resume previous diet.                           - Continue present medications.                           - Await pathology results.                           - Repeat colonoscopy is not recommended for                            surveillance.                           - Daily iron supplementation.                           - Follow up with Dr. Loreta Ave.                           - Check iron panel and ferritin in 3 months. Procedure Code(s):        --- Professional ---                           [phone], Colonoscopy, flexible; with removal of                            tumor(s), polyp(s), or other lesion(s) by snare                            technique Diagnosis Code(s):        --- Professional ---                           K62.1, Rectal polyp                           K63.5, Polyp of colon                           D50.9, Iron deficiency anemia, unspecified                           K57.30, Diverticulosis of large intestine without                            perforation or abscess without bleeding CPT copyright 2019 American Medical Association. All rights reserved. The codes documented in this report are  preliminary and upon coder review may  be revised to meet current compliance requirements. Jeani Hawking, MD Jeani Hawking, MD 05/05/2021 8:50:35 AM This report has been signed  electronically. Number of Addenda: 0

## 2021-05-05 NOTE — Discharge Instructions (Signed)

## 2021-05-05 NOTE — H&P (Signed)
Rhona Raider HPI: Further this 81 year old black female presents to the office for evaluation of iron deficiency anemia. Lab work done on 03/10/2021 which revealed a hemoglobin of 9.0 gm/dl and an Iron of 23. She has been more tired but is not weak or dizzy.  She has 1 BM every other day with no obvious blood or mucus in the stool. She denies NSAID use. She is taking Omeprazole 40 mg per day for acid reflux with good control. She has a good appetite and her weight has been stable. She denies having any complaints of abdominal pain, nausea, vomiting, dysphagia or odynophagia. She denies having a family history of colon cancer, celiac sprue or IBD. Her last colonoscopy was done on 10/27/2014 when a few scattered diverticula were noted in the sigmoid colon a tubular adenoma was removed from the cecum.   Past Medical History:  Diagnosis Date   Arthritis    Diabetes mellitus without complication (HCC)    GERD (gastroesophageal reflux disease)    High cholesterol    Hypertension     Past Surgical History:  Procedure Laterality Date   BREAST LUMPECTOMY WITH RADIOACTIVE SEED LOCALIZATION Right 06/21/2015   Procedure: BREAST LUMPECTOMY WITH RADIOACTIVE SEED LOCALIZATION;  Surgeon: Erroll Luna, MD;  Location: [place];  Service: General;  Laterality: Right;   JOINT REPLACEMENT Left    TONSILLECTOMY     TUBAL LIGATION      History reviewed. No pertinent family history.  Social History:  reports that she has never smoked. She has never used smokeless tobacco. She reports that she does not drink alcohol and does not use drugs.  Allergies:  Allergies  Allergen Reactions   Codeine Nausea And Vomiting    Other reaction(s): Dizziness, GI intolerance, Vomiting   Benzyl Alcohol     Other reaction(s): Sedation    Promethazine Other (See Comments)    Other reaction(s):  Sedation    Medications: Scheduled: Continuous:  sodium chloride     lactated ringers      No results found  for this or any previous visit (from the past 24 hour(s)).   No results found.  ROS:  As stated above in the HPI otherwise negative.  Blood pressure (!) 167/57, pulse 75, temperature 98.5 F (36.9 C), temperature source Tympanic, resp. rate 18, height 5\' 3"  (1.6 m), weight 90.7 kg, SpO2 100 %.    PE: Gen: NAD, Alert and Oriented HEENT:  [place]/AT, EOMI Neck: Supple, no LAD Lungs: CTA Bilaterally CV: RRR without M/G/R ABD: Soft, NTND, +BS Ext: No C/C/E  Assessment/Plan: 1) IDA - EGD/colonoscopy.  Triva Hueber D 05/05/2021, 8:08 AM

## 2021-05-08 ENCOUNTER — Encounter (HOSPITAL_COMMUNITY): Payer: Self-pay | Admitting: Gastroenterology

## 2021-05-11 LAB — SURGICAL PATHOLOGY

## 2021-05-15 ENCOUNTER — Encounter: Payer: Self-pay | Admitting: Podiatry

## 2021-05-15 ENCOUNTER — Ambulatory Visit (INDEPENDENT_AMBULATORY_CARE_PROVIDER_SITE_OTHER): Payer: Medicare Other | Admitting: Podiatry

## 2021-05-15 ENCOUNTER — Other Ambulatory Visit: Payer: Self-pay

## 2021-05-15 DIAGNOSIS — M79675 Pain in left toe(s): Secondary | ICD-10-CM | POA: Diagnosis not present

## 2021-05-15 DIAGNOSIS — M79674 Pain in right toe(s): Secondary | ICD-10-CM | POA: Diagnosis not present

## 2021-05-15 DIAGNOSIS — B351 Tinea unguium: Secondary | ICD-10-CM

## 2021-05-15 DIAGNOSIS — E1142 Type 2 diabetes mellitus with diabetic polyneuropathy: Secondary | ICD-10-CM | POA: Diagnosis not present

## 2021-05-21 NOTE — Progress Notes (Signed)
°  Subjective:  Patient ID: Lisa Alexander, female    DOB: 1940-06-24,  MRN: [id number]  81 y.o. female presents preventative diabetic foot care and painful elongated mycotic toenails 1-5 bilaterally which are tender when wearing enclosed shoe gear. Pain is relieved with periodic professional debridement.  Patient does not monitor blood glucose daily.  New problem(s): None   PCP is [name], MD , and last visit was 03/08/2021.  Allergies  Allergen Reactions   Codeine Nausea And Vomiting    Other reaction(s): Dizziness, GI intolerance, Vomiting   Benzyl Alcohol     Other reaction(s): Sedation    Promethazine Other (See Comments)    Other reaction(s):  Sedation    Review of Systems: Negative except as noted in the HPI.   Objective:  Vascular Examination: CFT <3 seconds b/l LE. Faintly palpable DP pulses b/l LE. Faintly palpable PT pulse(s) b/l LE. Pedal hair absent. No pain with calf compression b/l. No edema noted b/l LE. No cyanosis or clubbing noted b/l LE.  Neurological Examination: Pt has subjective symptoms of neuropathy. Protective sensation intact 5/5 intact bilaterally with 10g monofilament b/l.  Dermatological Examination: Pedal skin is warm and supple b/l LE. No open wounds b/l LE. No interdigital macerations noted b/l LE. Toenails 1-5 bilaterally elongated, discolored, dystrophic, thickened, and crumbly with subungual debris and tenderness to dorsal palpation.  Musculoskeletal Examination: Muscle strength 5/5 to b/l LE. Hammertoe(s) noted to the bilateral 5th toes.  Radiographs: None   Assessment:   1. Pain due to onychomycosis of toenails of both feet   2. Diabetic peripheral neuropathy (HCC)    Plan:  -Continue foot and shoe inspections daily. Monitor blood glucose per PCP/Endocrinologist's recommendations. -Mycotic toenails 1-5 bilaterally were debrided in length and girth with sterile nail nippers and dremel without incident. -Patient/POA to call  should there be question/concern in the interim.  Return in about 3 months (around 08/13/2021).  Freddie Breech, DPM

## 2021-08-07 ENCOUNTER — Ambulatory Visit (HOSPITAL_COMMUNITY)
Admission: RE | Admit: 2021-08-07 | Discharge: 2021-08-07 | Disposition: A | Discharge status: Home / Self Care | Payer: Medicare Other | Source: Ambulatory Visit | Attending: Gastroenterology | Admitting: Gastroenterology

## 2021-08-07 ENCOUNTER — Encounter (HOSPITAL_COMMUNITY)
Admission: RE | Disposition: A | Discharge status: Home / Self Care | Payer: Self-pay | Source: Ambulatory Visit | Attending: Gastroenterology

## 2021-08-07 DIAGNOSIS — K5520 Angiodysplasia of colon without hemorrhage: Secondary | ICD-10-CM | POA: Diagnosis not present

## 2021-08-07 DIAGNOSIS — D509 Iron deficiency anemia, unspecified: Secondary | ICD-10-CM | POA: Diagnosis present

## 2021-08-07 HISTORY — PX: GIVENS CAPSULE STUDY: SHX5432

## 2021-08-07 SURGERY — IMAGING PROCEDURE, GI TRACT, INTRALUMINAL, VIA CAPSULE

## 2021-08-07 SURGICAL SUPPLY — 1 items: TOWEL COTTON PACK 4EA (MISCELLANEOUS) ×4 IMPLANT

## 2021-08-08 ENCOUNTER — Encounter (HOSPITAL_COMMUNITY): Payer: Self-pay | Admitting: Gastroenterology

## 2021-08-15 ENCOUNTER — Encounter: Payer: Self-pay | Admitting: Podiatry

## 2021-08-15 ENCOUNTER — Ambulatory Visit (INDEPENDENT_AMBULATORY_CARE_PROVIDER_SITE_OTHER): Payer: Medicare Other | Admitting: Podiatry

## 2021-08-15 ENCOUNTER — Other Ambulatory Visit: Payer: Self-pay | Admitting: Gastroenterology

## 2021-08-15 DIAGNOSIS — B351 Tinea unguium: Secondary | ICD-10-CM | POA: Diagnosis not present

## 2021-08-15 DIAGNOSIS — E1142 Type 2 diabetes mellitus with diabetic polyneuropathy: Secondary | ICD-10-CM

## 2021-08-15 DIAGNOSIS — M79675 Pain in left toe(s): Secondary | ICD-10-CM

## 2021-08-15 DIAGNOSIS — M79674 Pain in right toe(s): Secondary | ICD-10-CM

## 2021-08-15 DIAGNOSIS — M2041 Other hammer toe(s) (acquired), right foot: Secondary | ICD-10-CM | POA: Diagnosis not present

## 2021-08-15 DIAGNOSIS — E119 Type 2 diabetes mellitus without complications: Secondary | ICD-10-CM

## 2021-08-15 DIAGNOSIS — M2042 Other hammer toe(s) (acquired), left foot: Secondary | ICD-10-CM

## 2021-08-15 NOTE — Progress Notes (Signed)
ANNUAL DIABETIC FOOT EXAM ? ?Subjective: ?Lisa Alexander presents today for annual diabetic foot examination. ? ?Patient relates 20 year h/o diabetes. ? ?Patient denies any h/o foot wounds. ?  ?Patient admits symptoms of foot tingling and cramping on occasion.. ? ?Patient did not check blood glucose this morning. ? ?Risk factors: diabetes, HTN, hyperlipidemia, hypercholesterolemia. ? ?Willey Blade, MD is patient's PCP. Last visit was May 30, 2021. ? ?Past Medical History:  ?Diagnosis Date  ? Arthritis   ? Diabetes mellitus without complication ([place])   ? GERD (gastroesophageal reflux disease)   ? High cholesterol   ? Hypertension   ? ?Patient Active Problem List  ? Diagnosis Date Noted  ? Gastroesophageal reflux disease without esophagitis 01/31/2021  ? Vitamin D deficiency 01/31/2021  ? Overactive bladder 07/18/2020  ? Obesity with body mass index 30 or greater 07/18/2020  ? Allergic rhinitis 07/18/2020  ? Anemia 07/18/2020  ? Osteoarthritis 07/18/2020  ? Benign essential hypertension 07/18/2020  ? Constipation 07/18/2020  ? Dysphagia 07/18/2020  ? Diabetic peripheral neuropathy ([place]) 07/06/2020  ? Hyperlipidemia 04/12/2020  ? Hypertension 04/12/2020  ? Primary localized osteoarthritis of right knee 04/12/2020  ? AKI (acute kidney injury) ([place]) 07/07/2018  ? Calculus of gallbladder without cholecystitis without obstruction 07/02/2018  ? Hyponatremia 07/02/2018  ? Acute cystitis without hematuria 06/30/2018  ? Hypertensive urgency 06/30/2018  ? Intractable nausea and vomiting 06/30/2018  ? Hypercalcemia 06/30/2018  ? Glaucoma suspect of both eyes 03/26/2011  ? Hypermetropia 03/26/2011  ? Nuclear senile cataract 03/26/2011  ? Presbyopia 03/26/2011  ? Type 2 diabetes mellitus without complications ([place]) 123456  ? ?Past Surgical History:  ?Procedure Laterality Date  ? BIOPSY  05/05/2021  ? Procedure: BIOPSY;  Surgeon: Carol Ada, MD;  Location: [facility];  Service: Endoscopy;;  ? BREAST LUMPECTOMY WITH  RADIOACTIVE SEED LOCALIZATION Right 06/21/2015  ? Procedure: BREAST LUMPECTOMY WITH RADIOACTIVE SEED LOCALIZATION;  Surgeon: Erroll Luna, MD;  Location: [place];  Service: General;  Laterality: Right;  ? COLONOSCOPY WITH PROPOFOL N/A 05/05/2021  ? Procedure: COLONOSCOPY WITH PROPOFOL;  Surgeon: Carol Ada, MD;  Location: [facility];  Service: Endoscopy;  Laterality: N/A;  ? ENTEROSCOPY N/A 08/18/2021  ? Procedure: ENTEROSCOPY;  Surgeon: Carol Ada, MD;  Location: [facility];  Service: Gastroenterology;  Laterality: N/A;  ? ESOPHAGOGASTRODUODENOSCOPY (EGD) WITH PROPOFOL N/A 05/05/2021  ? Procedure: ESOPHAGOGASTRODUODENOSCOPY (EGD) WITH PROPOFOL;  Surgeon: Carol Ada, MD;  Location: [facility];  Service: Endoscopy;  Laterality: N/A;  ? GIVENS CAPSULE STUDY N/A 08/07/2021  ? Procedure: GIVENS CAPSULE STUDY;  Surgeon: Juanita Craver, MD;  Location: [place];  Service: Gastroenterology;  Laterality: N/A;  ? JOINT REPLACEMENT Left   ? POLYPECTOMY  05/05/2021  ? Procedure: POLYPECTOMY;  Surgeon: Carol Ada, MD;  Location: [facility];  Service: Endoscopy;;  ? TONSILLECTOMY    ? TUBAL LIGATION    ? ?Current Outpatient Medications on File Prior to Visit  ?Medication Sig Dispense Refill  ? acetaminophen (TYLENOL) 500 MG tablet Take 1,000 mg by mouth every 6 (six) hours.    ? amLODipine (NORVASC) 10 MG tablet Take 10 mg by mouth daily.    ? aspirin 81 MG tablet Take 81 mg by mouth daily.    ? atenolol (TENORMIN) 50 MG tablet Take 50 mg by mouth 2 (two) times daily.    ? budesonide-formoterol (SYMBICORT) 80-4.5 MCG/ACT inhaler Inhale 1 puff into the lungs 2 (two) times daily.    ? cetirizine (ZYRTEC) 10 MG tablet Take by mouth.    ?  chlorthalidone (HYGROTON) 25 MG tablet Take 25 mg by mouth daily.    ? cholecalciferol (VITAMIN D3) 25 MCG (1000 UNIT) tablet Take 1,000 Units by mouth daily.    ? ciclopirox (PENLAC) 8 % solution Apply 1 application topically daily as needed (toe nails).    ?  Cinnamon 500 MG TABS Take 1,000 mg by mouth in the morning and at bedtime.    ? guaiFENesin (MUCINEX) 600 MG 12 hr tablet Take 600 mg by mouth 2 (two) times daily.    ? insulin glargine (LANTUS) 100 UNIT/ML injection Inject 25 Units into the skin at bedtime.    ? Menthol, Topical Analgesic, (BIOFREEZE) 4 % GEL Apply 1 application topically daily as needed (pain).    ? metFORMIN (GLUCOPHAGE) 500 MG tablet Take 500 mg by mouth 2 (two) times daily with a meal.    ? Omega-3 Fatty Acids (FISH OIL) 1000 MG CAPS Take 1,000 mg by mouth in the morning and at bedtime.    ? omeprazole (PRILOSEC) 40 MG capsule Take 40 mg by mouth daily.    ? OVER THE COUNTER MEDICATION Take 1 spray by mouth as needed (coughing). Biocidin Throat spray    ? simvastatin (ZOCOR) 20 MG tablet Take 20 mg by mouth daily.    ? Turmeric Curcumin 500 MG CAPS Take 500 mg by mouth in the morning and at bedtime.    ? vitamin B-12 (CYANOCOBALAMIN) 1000 MCG tablet Take 1,000 mcg by mouth daily.    ? vitamin C (ASCORBIC ACID) 500 MG tablet Take 500 mg by mouth daily.    ? vitamin E 1000 UNIT capsule Take by mouth.    ? Vitamin E 670 MG (1000 UT) CAPS Take 1,000 Units by mouth daily.    ? ?No current facility-administered medications on file prior to visit.  ?  ?Allergies  ?Allergen Reactions  ? Codeine Nausea And Vomiting  ?  Other reaction(s): Dizziness, GI intolerance, Vomiting  ? Benzyl Alcohol   ?  Other reaction(s): ?Sedation ?  ? Promethazine Other (See Comments)  ?  Other reaction(s):  ?Sedation  ? ?Social History  ? ?Occupational History  ? Not on file  ?Tobacco Use  ? Smoking status: Never  ? Smokeless tobacco: Never  ?Substance and Sexual Activity  ? Alcohol use: No  ? Drug use: No  ? Sexual activity: Not on file  ? ?History reviewed. No pertinent family history. ?Immunization History  ?Administered Date(s) Administered  ? PFIZER(Purple Top)SARS-COV-2 Vaccination 05/15/2019, 06/05/2019, 02/12/2020  ?  ? ?Review of Systems: Negative except as noted in  the HPI.  ? ?Objective: ?There were no vitals filed for this visit. ? ?Lisa Alexander is a pleasant 81 y.o. female in NAD. AAO X 3. ? ?Vascular Examination: ?CFT <3 seconds b/l LE. Palpable DP pulse(s) b/l LE. Faintly palpable PT pulse(s) b/l LE. Pedal hair absent. No pain with calf compression b/l. Lower extremity skin temperature gradient within normal limits. No edema noted b/l LE. No cyanosis or clubbing noted b/l LE. ? ?Dermatological Examination: ?Pedal integument with normal turgor, texture and tone b/l LE. No open wounds b/l. No interdigital macerations b/l. Toenails 1-5 b/l elongated, thickened, discolored with subungual debris. +Tenderness with dorsal palpation of nailplates. No hyperkeratotic or porokeratotic lesions present. ? ?Neurological Examination: ?Pt has subjective symptoms of neuropathy. Protective sensation intact 5/5 intact bilaterally with 10g monofilament b/l. Vibratory sensation intact b/l. ? ?Musculoskeletal Examination: ?Muscle strength 5/5 to all lower extremity muscle groups bilaterally. No pain, crepitus or  joint limitation noted with ROM bilateral LE. Hammertoe(s) noted to the bilateral 5th toes. Uses walking stick for ambulation assistance. ? ?Footwear Assessment: ?Does the patient wear appropriate shoes? Yes. ?Does the patient need inserts/orthotics? No. ? ?Assessment: ?1. Pain due to onychomycosis of toenails of both feet   ?2. Acquired hammertoes of both feet   ?3. Diabetic peripheral neuropathy ([place])   ?4. Encounter for diabetic foot exam ([place])   ?  ?ADA Risk Categorization: ?Low Risk :  ?Patient has all of the following: ?Intact protective sensation ?No prior foot ulcer  ?No severe deformity ?Pedal pulses present ? ?Plan: ?-Patient was evaluated and treated. All patient's and/or POA's questions/concerns answered on today's visit. ?-Diabetic foot examination performed today. ?-Continue foot and shoe inspections daily. Monitor blood glucose per PCP/Endocrinologist's  recommendations. ?-Patient to continue soft, supportive shoe gear daily. ?-Mycotic toenails 1-5 bilaterally were debrided in length and girth with sterile nail nippers and dremel without incident. ?-Patient/POA to call s

## 2021-08-15 NOTE — Patient Instructions (Signed)
It was my pleasure serving you today! You had your annual diabetic foot examination performed today. You will notice a charge for an office visit on today's billing and this is for your annual diabetic foot exam. It is done once yearly.  ? ?Diabetes Mellitus and Foot Care ?Foot care is an important part of your health, especially when you have diabetes. Diabetes may cause you to have problems because of poor blood flow (circulation) to your feet and legs, which can cause your skin to: ?Become thinner and drier. ?Break more easily. ?Heal more slowly. ?Peel and crack. ?You may also have nerve damage (neuropathy) in your legs and feet, causing decreased feeling in them. This means that you may not notice minor injuries to your feet that could lead to more serious problems. Noticing and addressing any potential problems early is the best way to prevent future foot problems. ?How to care for your feet ?Foot hygiene ? ?Wash your feet daily with warm water and mild soap. Do not use hot water. Then, pat your feet and the areas between your toes until they are completely dry. Do not soak your feet as this can dry your skin. ?Trim your toenails straight across. Do not dig under them or around the cuticle. File the edges of your nails with an emery board or nail file. ?Apply a moisturizing lotion or petroleum jelly to the skin on your feet and to dry, brittle toenails. Use lotion that does not contain alcohol and is unscented. Do not apply lotion between your toes. ?Shoes and socks ?Wear clean socks or stockings every day. Make sure they are not too tight. Do not wear knee-high stockings since they may decrease blood flow to your legs. ?Wear shoes that fit properly and have enough cushioning. Always look in your shoes before you put them on to be sure there are no objects inside. ?To break in new shoes, wear them for just a few hours a day. This prevents injuries on your feet. ?Wounds, scrapes, corns, and calluses ? ?Check  your feet daily for blisters, cuts, bruises, sores, and redness. If you cannot see the bottom of your feet, use a mirror or ask someone for help. ?Do not cut corns or calluses or try to remove them with medicine. ?If you find a minor scrape, cut, or break in the skin on your feet, keep it and the skin around it clean and dry. You may clean these areas with mild soap and water. Do not clean the area with peroxide, alcohol, or iodine. ?If you have a wound, scrape, corn, or callus on your foot, look at it several times a day to make sure it is healing and not infected. Check for: ?Redness, swelling, or pain. ?Fluid or blood. ?Warmth. ?Pus or a bad smell. ?General tips ?Do not cross your legs. This may decrease blood flow to your feet. ?Do not use heating pads or hot water bottles on your feet. They may burn your skin. If you have lost feeling in your feet or legs, you may not know this is happening until it is too late. ?Protect your feet from hot and cold by wearing shoes, such as at the beach or on hot pavement. ?Schedule a complete foot exam at least once a year (annually) or more often if you have foot problems. Report any cuts, sores, or bruises to your health care provider immediately. ?Where to find more information ?American Diabetes Association: www.diabetes.org ?Association of Diabetes Care & Education Specialists: www.diabeteseducator.org ?Contact   a health care provider if: ?You have a medical condition that increases your risk of infection and you have any cuts, sores, or bruises on your feet. ?You have an injury that is not healing. ?You have redness on your legs or feet. ?You feel burning or tingling in your legs or feet. ?You have pain or cramps in your legs and feet. ?Your legs or feet are numb. ?Your feet always feel cold. ?You have pain around any toenails. ?Get help right away if: ?You have a wound, scrape, corn, or callus on your foot and: ?You have pain, swelling, or redness that gets worse. ?You  have fluid or blood coming from the wound, scrape, corn, or callus. ?Your wound, scrape, corn, or callus feels warm to the touch. ?You have pus or a bad smell coming from the wound, scrape, corn, or callus. ?You have a fever. ?You have a red line going up your leg. ?Summary ?Check your feet every day for blisters, cuts, bruises, sores, and redness. ?Apply a moisturizing lotion or petroleum jelly to the skin on your feet and to dry, brittle toenails. ?Wear shoes that fit properly and have enough cushioning. ?If you have foot problems, report any cuts, sores, or bruises to your health care provider immediately. ?Schedule a complete foot exam at least once a year (annually) or more often if you have foot problems. ?This information is not intended to replace advice given to you by your health care provider. Make sure you discuss any questions you have with your health care provider. ?Document Revised: 10/29/2019 Document Reviewed: 10/29/2019 ?Elsevier Patient Education ? 2023 [employer] ? ?

## 2021-08-17 NOTE — Anesthesia Preprocedure Evaluation (Addendum)
Anesthesia Evaluation  ?Patient identified by MRN, date of birth, ID band ?Patient awake ? ? ? ?Reviewed: ?Allergy & Precautions, NPO status , Patient's Chart, lab work & pertinent test results, reviewed documented beta blocker date and time  ? ?Airway ?Mallampati: II ? ?TM Distance: >3 FB ?Neck ROM: Full ? ? ? Dental ? ?(+) Partial Upper, Missing, Poor Dentition, Dental Advisory Given ?  ?Pulmonary ?neg pulmonary ROS,  ?  ?Pulmonary exam normal ?breath sounds clear to auscultation ? ? ? ? ? ? Cardiovascular ?hypertension, Pt. on home beta blockers and Pt. on medications ?Normal cardiovascular exam ?Rhythm:Regular Rate:Normal ? ? ?  ?Neuro/Psych ? Neuromuscular disease   ? GI/Hepatic ?Neg liver ROS, GERD  ,  ?Endo/Other  ?negative endocrine ROSdiabetes ? Renal/GU ?Renal disease  ? ?  ?Musculoskeletal ? ?(+) Arthritis ,  ? Abdominal ?(+) + obese,   ?Peds ? Hematology ? ?(+) Blood dyscrasia, anemia ,   ?Anesthesia Other Findings ? ? Reproductive/Obstetrics ? ?  ? ? ? ? ? ? ? ? ? ? ? ? ? ?  ?  ? ? ? ? ? ?Anesthesia Physical ?Anesthesia Plan ? ?ASA: 3 ? ?Anesthesia Plan: MAC  ? ?Post-op Pain Management: Minimal or no pain anticipated  ? ?Induction: Intravenous ? ?PONV Risk Score and Plan: 2 and Ondansetron, Treatment may vary due to age or medical condition and Propofol infusion ? ?Airway Management Planned: Natural Airway ? ?Additional Equipment:  ? ?Intra-op Plan:  ? ?Post-operative Plan:  ? ?Informed Consent: I have reviewed the patients History and Physical, chart, labs and discussed the procedure including the risks, benefits and alternatives for the proposed anesthesia with the patient or authorized representative who has indicated his/her understanding and acceptance.  ? ? ? ?Dental advisory given ? ?Plan Discussed with: CRNA ? ?Anesthesia Plan Comments:   ? ? ? ? ? ?Anesthesia Quick Evaluation ? ?

## 2021-08-18 ENCOUNTER — Ambulatory Visit (HOSPITAL_COMMUNITY): Payer: Medicare Other | Admitting: Certified Registered"

## 2021-08-18 ENCOUNTER — Ambulatory Visit (HOSPITAL_BASED_OUTPATIENT_CLINIC_OR_DEPARTMENT_OTHER): Payer: Medicare Other | Admitting: Certified Registered"

## 2021-08-18 ENCOUNTER — Encounter (HOSPITAL_COMMUNITY): Payer: Self-pay | Admitting: Gastroenterology

## 2021-08-18 ENCOUNTER — Other Ambulatory Visit: Payer: Self-pay

## 2021-08-18 ENCOUNTER — Encounter (HOSPITAL_COMMUNITY)
Admission: RE | Disposition: A | Discharge status: Still In Hospital | Payer: Self-pay | Source: Home / Self Care | Attending: Gastroenterology

## 2021-08-18 ENCOUNTER — Ambulatory Visit (HOSPITAL_COMMUNITY)
Admission: RE | Admit: 2021-08-18 | Discharge: 2021-08-18 | Disposition: A | Discharge status: Still In Hospital | Payer: Medicare Other | Attending: Gastroenterology | Admitting: Gastroenterology

## 2021-08-18 ENCOUNTER — Encounter (HOSPITAL_COMMUNITY): Payer: Self-pay | Admitting: Emergency Medicine

## 2021-08-18 ENCOUNTER — Emergency Department (HOSPITAL_COMMUNITY)
Admission: EM | Admit: 2021-08-18 | Discharge: 2021-08-18 | Disposition: A | Discharge status: Home / Self Care | Payer: Medicare Other | Source: Home / Self Care | Attending: Emergency Medicine | Admitting: Emergency Medicine

## 2021-08-18 DIAGNOSIS — K2970 Gastritis, unspecified, without bleeding: Secondary | ICD-10-CM | POA: Diagnosis not present

## 2021-08-18 DIAGNOSIS — R55 Syncope and collapse: Secondary | ICD-10-CM | POA: Insufficient documentation

## 2021-08-18 DIAGNOSIS — K219 Gastro-esophageal reflux disease without esophagitis: Secondary | ICD-10-CM | POA: Diagnosis not present

## 2021-08-18 DIAGNOSIS — D509 Iron deficiency anemia, unspecified: Secondary | ICD-10-CM | POA: Insufficient documentation

## 2021-08-18 DIAGNOSIS — M1990 Unspecified osteoarthritis, unspecified site: Secondary | ICD-10-CM | POA: Diagnosis not present

## 2021-08-18 DIAGNOSIS — I10 Essential (primary) hypertension: Secondary | ICD-10-CM | POA: Diagnosis not present

## 2021-08-18 DIAGNOSIS — Z7982 Long term (current) use of aspirin: Secondary | ICD-10-CM | POA: Insufficient documentation

## 2021-08-18 DIAGNOSIS — Z79899 Other long term (current) drug therapy: Secondary | ICD-10-CM | POA: Diagnosis not present

## 2021-08-18 DIAGNOSIS — E119 Type 2 diabetes mellitus without complications: Secondary | ICD-10-CM | POA: Diagnosis not present

## 2021-08-18 HISTORY — PX: ENTEROSCOPY: SHX5533

## 2021-08-18 LAB — BASIC METABOLIC PANEL
Anion gap: 8 (ref 5–15)
BUN: 35 mg/dL — ABNORMAL HIGH (ref 8–23)
CO2: 26 mmol/L (ref 22–32)
Calcium: 10.2 mg/dL (ref 8.9–10.3)
Chloride: 97 mmol/L — ABNORMAL LOW (ref 98–111)
Creatinine, Ser: 1.24 mg/dL — ABNORMAL HIGH (ref 0.44–1.00)
GFR, Estimated: 44 mL/min — ABNORMAL LOW (ref >60)
Glucose, Bld: 240 mg/dL — ABNORMAL HIGH (ref 70–99)
Potassium: 3.9 mmol/L (ref 3.5–5.1)
Sodium: 131 mmol/L — ABNORMAL LOW (ref 135–145)

## 2021-08-18 LAB — CBC
HCT: 26 % — ABNORMAL LOW (ref 36.0–46.0)
Hemoglobin: 7.5 g/dL — ABNORMAL LOW (ref 12.0–15.0)
MCH: 20.3 pg — ABNORMAL LOW (ref 26.0–34.0)
MCHC: 28.8 g/dL — ABNORMAL LOW (ref 30.0–36.0)
MCV: 70.5 fL — ABNORMAL LOW (ref 80.0–100.0)
Platelets: 272 10*3/uL (ref 150–400)
RBC: 3.69 MIL/uL — ABNORMAL LOW (ref 3.87–5.11)
RDW: 16.6 % — ABNORMAL HIGH (ref 11.5–15.5)
WBC: 11.7 10*3/uL — ABNORMAL HIGH (ref 4.0–10.5)
nRBC: 0.2 % (ref 0.0–0.2)

## 2021-08-18 LAB — GLUCOSE, CAPILLARY
Glucose-Capillary: 300 mg/dL — ABNORMAL HIGH (ref 70–99)
Glucose-Capillary: 278 mg/dL — ABNORMAL HIGH (ref 70–99)
Glucose-Capillary: 230 mg/dL — ABNORMAL HIGH (ref 70–99)
Glucose-Capillary: 268 mg/dL — ABNORMAL HIGH (ref 70–99)

## 2021-08-18 LAB — CBG MONITORING, ED: Glucose-Capillary: 232 mg/dL — ABNORMAL HIGH (ref 70–99)

## 2021-08-18 SURGERY — ENTEROSCOPY
Anesthesia: Monitor Anesthesia Care

## 2021-08-18 MED ORDER — INSULIN ASPART 100 UNIT/ML IJ SOLN
8.0000 [IU] | Freq: Once | INTRAMUSCULAR | Status: AC
  Administered 2021-08-18: 8 [IU] via SUBCUTANEOUS

## 2021-08-18 MED ORDER — PROPOFOL 10 MG/ML IV BOLUS
INTRAVENOUS | Status: DC | PRN
  Administered 2021-08-18 (×2): 20 mg via INTRAVENOUS
  Administered 2021-08-18: 10 mg via INTRAVENOUS

## 2021-08-18 MED ORDER — PROPOFOL 500 MG/50ML IV EMUL
INTRAVENOUS | Status: DC | PRN
  Administered 2021-08-18: 150 ug/kg/min via INTRAVENOUS

## 2021-08-18 MED ORDER — SODIUM CHLORIDE 0.9 % IV BOLUS
500.0000 mL | Freq: Once | INTRAVENOUS | Status: AC
  Administered 2021-08-18: 500 mL via INTRAVENOUS

## 2021-08-18 MED ORDER — LACTATED RINGERS IV SOLN
INTRAVENOUS | Status: DC | PRN
Start: 2021-08-18 — End: 2021-08-18

## 2021-08-18 MED ORDER — GLYCOPYRROLATE 0.2 MG/ML IJ SOLN
INTRAMUSCULAR | Status: DC | PRN
  Administered 2021-08-18: .1 mg via INTRAVENOUS

## 2021-08-18 MED ORDER — INSULIN ASPART 100 UNIT/ML IJ SOLN
INTRAMUSCULAR | Status: AC
  Filled 2021-08-18: qty 1

## 2021-08-18 NOTE — H&P (Signed)
Lisa Alexander ?HPI: This 81 year old back female presents to the office for a 3 month follow up. She has iron deficiency anemia. Her last CBC done on 03/08/2021 revealed a Hemoglobin of 9.0 gm/dl. She has not been taking the Iron supplement because it causes constipation. She has 2-3 BM's every 2-3 days with no obvious blood or mucus in the stool. She is taking Omeprazole for acid reflux with good control. She has good appetite and her weight has been stable. She denies having any complaints of abdominal pain, nausea, vomiting, dysphagia or odynophagia. She denies having a family history of colon cancer, celiac sprue or IBD. 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 Pylori was noted. A colonoscopy was done at the same time which revealed diverticulosis in the sigmoid colon and biopsies of the transverse colon revealed colonic mucosa with no specific pathologic diagnosis. In 2016 and 2013 tubular adenomas were removed..  A capsule endoscopy was performed on 08/07/2021 and it was positive for AVMs. ? ?Past Medical History:  ?Diagnosis Date  ? Arthritis   ? Diabetes mellitus without complication (HCC)   ? GERD (gastroesophageal reflux disease)   ? High cholesterol   ? Hypertension   ? ? ?Past Surgical History:  ?Procedure Laterality Date  ? BIOPSY  05/05/2021  ? Procedure: BIOPSY;  Surgeon: Jeani Hawking, MD;  Location: [facility];  Service: Endoscopy;;  ? BREAST LUMPECTOMY WITH RADIOACTIVE SEED LOCALIZATION Right 06/21/2015  ? Procedure: BREAST LUMPECTOMY WITH RADIOACTIVE SEED LOCALIZATION;  Surgeon: Harriette Bouillon, MD;  Location: [facility];  Service: General;  Laterality: Right;  ? COLONOSCOPY WITH PROPOFOL N/A 05/05/2021  ? Procedure: COLONOSCOPY WITH PROPOFOL;  Surgeon: Jeani Hawking, MD;  Location: [facility];  Service: Endoscopy;  Laterality: N/A;  ? ESOPHAGOGASTRODUODENOSCOPY (EGD) WITH PROPOFOL N/A 05/05/2021   ? Procedure: ESOPHAGOGASTRODUODENOSCOPY (EGD) WITH PROPOFOL;  Surgeon: Jeani Hawking, MD;  Location: [facility];  Service: Endoscopy;  Laterality: N/A;  ? GIVENS CAPSULE STUDY N/A 08/07/2021  ? Procedure: GIVENS CAPSULE STUDY;  Surgeon: Charna Elizabeth, MD;  Location: [facility];  Service: Gastroenterology;  Laterality: N/A;  ? JOINT REPLACEMENT Left   ? POLYPECTOMY  05/05/2021  ? Procedure: POLYPECTOMY;  Surgeon: Jeani Hawking, MD;  Location: [facility];  Service: Endoscopy;;  ? TONSILLECTOMY    ? TUBAL LIGATION    ? ? ?No family history on file. ? ?Social History:  reports that she has never smoked. She has never used smokeless tobacco. She reports that she does not drink alcohol and does not use drugs. ? ?Allergies:  ?Allergies  ?Allergen Reactions  ? Codeine Nausea And Vomiting  ?  Other reaction(s): Dizziness, GI intolerance, Vomiting  ? Benzyl Alcohol   ?  Other reaction(s): ?Sedation ?  ? Promethazine Other (See Comments)  ?  Other reaction(s):  ?Sedation  ? ? ?Medications: Scheduled: ?Continuous: ? ?No results found for this or any previous visit (from the past 24 hour(s)).  ? ?No results found. ? ?ROS:  As stated above in the HPI otherwise negative. ? ?There were no vitals taken for this visit.   ? ?PE: ?Gen: NAD, Alert and Oriented ?HEENT:  [place]/AT, EOMI ?Neck: Supple, no LAD ?Lungs: CTA Bilaterally ?CV: RRR without M/G/R ?ABD: Soft, NTND, +BS ?Ext: No C/C/E ? ?Assessment/Plan: ?1) Small bowel AVMs. ?2) Anemia - 7.3 g/dL 1/61/0960. ? ?Plan: ?1) Enteroscopy with APC. ? ?Giordano Getman D ?08/18/2021, 7:02 AM  ? ? ?  ?

## 2021-08-18 NOTE — Discharge Instructions (Addendum)
You episode of passing out (syncope) was probably due to your coughing. However, you should follow up with your primary care physician for further testing as needed.  ? ?If you develop dizziness or lightheadedness, chest pain, shortness of breath, recurrent passing out, bleeding or any other new/concerning symptoms then return to the ER for evaluation. ?

## 2021-08-18 NOTE — ED Triage Notes (Signed)
Patient was in recovery from endoscopy, had some sprite, was alert and oriented. When pt was in restroom, became unresponsive appeared to have a syncopal episode, reports having issues with her throat after a procedure a few years ago, patient recalls trying to catch her breath before her episode. States this has happened in the past.  ?

## 2021-08-18 NOTE — Op Note (Signed)
[facility] ?Patient Name: Lisa Alexander ?Procedure Date: 08/18/2021 ?MRN: [id number] ?Attending MD: Jeani Hawking , MD ?Date of Birth: Mar 16, 1941 ?CSN: [id number] ?Age: 81 ?Admit Type: Inpatient ?Procedure:                Small bowel enteroscopy ?Indications:              Iron deficiency anemia ?Providers:                Jeani Hawking, MD, Willy Eddy, Champ Mungo,  ?                          Technician, Dorinda Hill ?Referring MD:              ?Medicines:                 ?Complications:            No immediate complications. ?Estimated Blood Loss:     Estimated blood loss: none. ?Procedure:                Pre-Anesthesia Assessment: ?                          - Prior to the procedure, a History and Physical  ?                          was performed, and patient medications and  ?                          allergies were reviewed. The patient's tolerance of  ?                          previous anesthesia was also reviewed. The risks  ?                          and benefits of the procedure and the sedation  ?                          options and risks were discussed with the patient.  ?                          All questions were answered, and informed consent  ?                          was obtained. Prior Anticoagulants: The patient has  ?                          taken no previous anticoagulant or antiplatelet  ?                          agents. ASA Grade Assessment: III - A patient with  ?                          severe systemic disease. After reviewing the risks  ?                          and  benefits, the patient was deemed in  ?                          satisfactory condition to undergo the procedure. ?                          - Sedation was administered by an anesthesia  ?                          professional. Deep sedation was attained. ?                          After obtaining informed consent, the endoscope was  ?                          passed under direct vision. Throughout  the  ?                          procedure, the patient's blood pressure, pulse, and  ?                          oxygen saturations were monitored continuously. The  ?                          [id number]) Olympus colonoscope was  ?                          introduced through the mouth and advanced to the  ?                          small bowel distal to the Ligament of Treitz. The  ?                          small bowel enteroscopy was accomplished without  ?                          difficulty. The patient tolerated the procedure  ?                          well. ?Scope In: ?Scope Out: ?Findings: ?     The esophagus was normal. ?     A large amount of food (residue) was found in the gastric fundus and in  ?     the gastric body. ?     Diffuse moderate inflammation characterized by erythema was found in the  ?     gastric antrum. ?     The examined duodenum was normal. ?     There was no evidence of significant pathology in the entire examined  ?     portion of jejunum. ?     There was a large amount of retained gastric contents in the fundus and  ?     body of the stomach. It was not possible to perform a retroflexion. In  ?     the antrum the intestinal metaplasia was again identified. Two deep  ?     passes into the proximal jejunum were  performed. There was no evidence  ?     of any AVMs, ulcerations, erosions, or inflammation with this  ?     examination in the visualized small bowel. ?Impression:               - Normal esophagus. ?                          - A large amount of food (residue) in the stomach. ?                          - Gastritis. ?                          - Normal examined duodenum. ?                          - The examined portion of the jejunum was normal. ?                          - No specimens collected. ?Recommendation:           - Patient has a contact number available for  ?                          emergencies. The signs and symptoms of potential  ?                           delayed complications were discussed with the  ?                          patient. Return to normal activities tomorrow.  ?                          Written discharge instructions were provided to the  ?                          patient. ?                          - Resume previous diet. ?                          - Iron supplementation. ?                          - Follow up with Dr. Collene Mares in 1 month. ?Procedure Code(s):        --- Professional --- ?                          [phone], Small intestinal endoscopy, enteroscopy  ?                          beyond second portion of duodenum, not including  ?                          ileum; diagnostic, including collection of  ?  specimen(s) by brushing or washing, when performed  ?                          (separate procedure) ?Diagnosis Code(s):        --- Professional --- ?                          K29.70, Gastritis, unspecified, without bleeding ?                          D50.9, Iron deficiency anemia, unspecified ?CPT copyright 2019 American Medical Association. All rights reserved. ?The codes documented in this report are preliminary and upon coder review may  ?be revised to meet current compliance requirements. ?Carol Ada, MD ?Carol Ada, MD ?08/18/2021 9:25:19 AM ?This report has been signed electronically. ?Number of Addenda: 0 ?

## 2021-08-18 NOTE — Anesthesia Postprocedure Evaluation (Signed)
Anesthesia Post Note ? ?Patient: Lisa Alexander ? ?Procedure(s) Performed: ENTEROSCOPY ? ?  ? ?Patient location during evaluation: PACU ?Anesthesia Type: MAC ?Level of consciousness: awake and alert ?Pain management: pain level controlled ?Vital Signs Assessment: post-procedure vital signs reviewed and stable ?Respiratory status: spontaneous breathing ?Cardiovascular status: stable ?Anesthetic complications: no ?Comments: Pt had presumed vagal event in bathroom prior to d/c. I requested pt be taken to ED. ? ? ?No notable events documented. ? ?Last Vitals:  ?Vitals:  ? 08/18/21 1020 08/18/21 1040  ?BP: (!) 156/55 (!) 190/72  ?Pulse: 72   ?Resp: 13   ?Temp:    ?SpO2: 100%   ?  ?Last Pain:  ?Vitals:  ? 08/18/21 1020  ?TempSrc:   ?PainSc: 0-No pain  ? ? ?  ?  ?  ?  ?  ?  ? ?Nolon Nations ? ? ? ? ?

## 2021-08-18 NOTE — Progress Notes (Signed)
Patient about to be discharged from Endoscopy when she had a syncopal episode in the bathroom. Patient found on the toilet unresponsive. A code was called. Two RNs lowered her to the ground. She did have a pulse and quickly regained consciousness. Anesthesia arrived and we sat the patient up. Lung sounds were clear. Patient sounded wheezy and short of breath. A blood pressure was taken in recovery prior to transport. Patient transported to the Emergency Room per anesthesia recommendations.  ?

## 2021-08-18 NOTE — ED Notes (Signed)
Patient provided with water, beef broth, pudding and crackers.  ?

## 2021-08-18 NOTE — ED Provider Notes (Signed)
?[facility]-EMERGENCY DEPT ?Provider Note ? ? ?CSN: [id number] ?Arrival date & time: 08/18/21  1047 ? ?  ? ?History ? ?No chief complaint on file. ? ? ?Lisa Alexander is a 81 y.o. female. ? ?HPI ?81 year old female presents with syncope.  She was in the bathroom in the PACU and started coughing and then passed out.  Family is present at the bedside.  She was getting an enteroscopy today.  She states that she was feeling like she really needed to urinate and was finally allowed to go to the bathroom.  She was in there by herself when she started coughing.  She and family note that the patient's been dealing with coughing episodes ever since an endoscopy and at times she will get lightheaded and short of breath when she is coughing so hard.  However she is never passed out before.  No chest pain. ? ?Home Medications ?Prior to Admission medications   ?Medication Sig Start Date End Date Taking? Authorizing Provider  ?acetaminophen (TYLENOL) 500 MG tablet Take 1,000 mg by mouth every 6 (six) hours.    [provider]  ?amLODipine (NORVASC) 10 MG tablet Take 10 mg by mouth daily.    [provider]  ?aspirin 81 MG tablet Take 81 mg by mouth daily.    [provider]  ?atenolol (TENORMIN) 50 MG tablet Take 50 mg by mouth 2 (two) times daily.    [provider]  ?budesonide-formoterol (SYMBICORT) 80-4.5 MCG/ACT inhaler Inhale 1 puff into the lungs 2 (two) times daily.    [provider]  ?cetirizine (ZYRTEC) 10 MG tablet Take by mouth.    [provider]  ?chlorthalidone (HYGROTON) 25 MG tablet Take 25 mg by mouth daily. 08/03/21   [provider]  ?cholecalciferol (VITAMIN D3) 25 MCG (1000 UNIT) tablet Take 1,000 Units by mouth daily.    [provider]  ?ciclopirox (PENLAC) 8 % solution Apply 1 application topically daily as needed (toe nails). 05/26/20   [provider]  ?Cinnamon 500 MG TABS Take 1,000 mg by mouth in the  morning and at bedtime.    [provider]  ?guaiFENesin (MUCINEX) 600 MG 12 hr tablet Take 600 mg by mouth 2 (two) times daily.    [provider]  ?insulin glargine (LANTUS) 100 UNIT/ML injection Inject 25 Units into the skin at bedtime.    [provider]  ?Menthol, Topical Analgesic, (BIOFREEZE) 4 % GEL Apply 1 application topically daily as needed (pain).    [provider]  ?metFORMIN (GLUCOPHAGE) 500 MG tablet Take 500 mg by mouth 2 (two) times daily with a meal.    [provider]  ?Omega-3 Fatty Acids (FISH OIL) 1000 MG CAPS Take 1,000 mg by mouth in the morning and at bedtime.    [provider]  ?omeprazole (PRILOSEC) 40 MG capsule Take 40 mg by mouth daily.    [provider]  ?OVER THE COUNTER MEDICATION Take 1 spray by mouth as needed (coughing). Biocidin Throat spray    [provider]  ?simvastatin (ZOCOR) 20 MG tablet Take 20 mg by mouth daily.    [provider]  ?Turmeric Curcumin 500 MG CAPS Take 500 mg by mouth in the morning and at bedtime.    [provider]  ?vitamin B-12 (CYANOCOBALAMIN) 1000 MCG tablet Take 1,000 mcg by mouth daily.    [provider]  ?vitamin C (ASCORBIC ACID) 500 MG tablet Take 500 mg by mouth daily.  [provider]  ?vitamin E 1000 UNIT capsule Take by mouth.    [provider]  ?Vitamin E 670 MG (1000 UT) CAPS Take 1,000 Units by mouth daily.    [provider]  ?   ? ?Allergies    ?Codeine, Benzyl alcohol, and Promethazine   ? ?Review of Systems   ?Review of Systems  ?Respiratory:  Positive for cough. Negative for shortness of breath.   ?Cardiovascular:  Negative for chest pain.  ?Neurological:  Positive for syncope.  ? ?Physical Exam ?Updated Vital Signs ?BP 140/68 (BP Location: Right Arm)   Pulse 72   Temp 98.1 ?F (36.7 ?C) (Oral)   Resp 12   SpO2 100%  ?Physical Exam ?Vitals and nursing note reviewed.  ?Constitutional:   ?    Appearance: She is well-developed.  ?HENT:  ?   Head: Normocephalic and atraumatic.  ?Cardiovascular:  ?   Rate and Rhythm: Normal rate and regular rhythm.  ?   Heart sounds: Normal heart sounds.  ?Pulmonary:  ?   Effort: Pulmonary effort is normal.  ?   Breath sounds: Normal breath sounds.  ?Abdominal:  ?   Palpations: Abdomen is soft.  ?   Tenderness: There is no abdominal tenderness.  ?Skin: ?   General: Skin is warm and dry.  ?Neurological:  ?   Mental Status: She is alert.  ?   Comments: 5/5 strength in all 4 extremities. No slurred speech  ? ? ?ED Results / Procedures / Treatments   ?Labs ?(all labs ordered are listed, but only abnormal results are displayed) ?Labs Reviewed  ?BASIC METABOLIC PANEL - Abnormal; Notable for the following components:  ?    Result Value  ? Sodium 131 (*)   ? Chloride 97 (*)   ? Glucose, Bld 240 (*)   ? BUN 35 (*)   ? Creatinine, Ser 1.24 (*)   ? GFR, Estimated 44 (*)   ? All other components within normal limits  ?CBC - Abnormal; Notable for the following components:  ? WBC 11.7 (*)   ? RBC 3.69 (*)   ? Hemoglobin 7.5 (*)   ? HCT 26.0 (*)   ? MCV 70.5 (*)   ? MCH 20.3 (*)   ? MCHC 28.8 (*)   ? RDW 16.6 (*)   ? All other components within normal limits  ?CBG MONITORING, ED - Abnormal; Notable for the following components:  ? Glucose-Capillary 232 (*)   ? All other components within normal limits  ?CBG MONITORING, ED  ? ? ?EKG ?EKG Interpretation ? ?Date/Time:  Friday August 18 2021 10:53:43 EDT ?Ventricular Rate:  81 ?PR Interval:  167 ?QRS Duration: 129 ?QT Interval:  393 ?QTC Calculation: 457 ?R Axis:   -50 ?Text Interpretation: Sinus rhythm RBBB and LAFB Left ventricular hypertrophy Lateral infarct, age indeterminate rate is faster, otherwise similar ECG compared to 2017 Confirmed by Pricilla Loveless [phone]) on 08/18/2021 1:06:51 PM ? ?Radiology ?No results found. ? ?Procedures ?Procedures  ? ? ?Medications Ordered in ED ?Medications  ?sodium chloride 0.9 % bolus 500 mL (0 mLs  Intravenous Stopped 08/18/21 1330)  ? ? ?ED Course/ Medical Decision Making/ A&P ?  ?                        ?Medical Decision Making ?Amount and/or Complexity of Data Reviewed ?Independent Historian:  ?   Details: family ?External Data Reviewed: notes. ?Labs: ordered. ?ECG/medicine tests: ordered and independent interpretation performed. ? ? ?  Chart reviewed.  She just had her procedure/enteroscopy prior to coming down here.  Chart review shows she also has had a hemoglobin in the 7 range over the last few weeks and at 7.5 today.  Does not seem like anemia is the cause of her symptoms as she has not been taking any iron and it seems like this is a stable value.  Otherwise, this is almost undoubtedly from the cough and recent anesthesia.  I doubt she had a primary cardiac event.  She has been having with sounds like near syncope whenever she coughs real hard over the last few months.  At this point, she is eating and drinking well, vitals are okay, ECG unremarkable.  I do not think she needs admission.  Will discharge with return precautions. ? ? ? ? ? ? ? ?Final Clinical Impression(s) / ED Diagnoses ?Final diagnoses:  ?Syncope and collapse  ? ? ?Rx / DC Orders ?ED Discharge Orders   ? ? None  ? ?  ? ? ?  ?Pricilla Loveless, MD ?08/18/21 1416 ? ?

## 2021-08-18 NOTE — Anesthesia Procedure Notes (Signed)
Procedure Name: [place] ?Date/Time: 08/18/2021 9:04 AM ?Performed by: Niel Hummer, CRNA ?Pre-anesthesia Checklist: Patient identified, Emergency Drugs available, Suction available and Patient being monitored ?Oxygen Delivery Method: Simple face mask ? ? ? ? ?

## 2021-08-18 NOTE — Transfer of Care (Signed)
Immediate Anesthesia Transfer of Care Note ? ?Patient: Lisa Alexander ? ?Procedure(s) Performed: ENTEROSCOPY ? ?Patient Location: PACU ? ?Anesthesia Type:MAC ? ?Level of Consciousness: drowsy and patient cooperative ? ?Airway & Oxygen Therapy: Patient Spontanous Breathing and Patient connected to face mask oxygen ? ?Post-op Assessment: Report given to RN and Post -op Vital signs reviewed and stable ? ?Post vital signs: Reviewed and stable ? ?Last Vitals:  ?Vitals Value Taken Time  ?BP 137/45 08/18/21 0923  ?Temp    ?Pulse 86 08/18/21 0925  ?Resp 13 08/18/21 0925  ?SpO2 100 % 08/18/21 0925  ?Vitals shown include unvalidated device data. ? ?Last Pain:  ?Vitals:  ? 08/18/21 0748  ?TempSrc: Temporal  ?PainSc: 0-No pain  ?   ? ?  ? ?Complications: No notable events documented. ?

## 2021-09-15 ENCOUNTER — Other Ambulatory Visit: Payer: Self-pay

## 2021-09-15 ENCOUNTER — Encounter (HOSPITAL_COMMUNITY): Payer: Self-pay

## 2021-09-15 ENCOUNTER — Emergency Department (HOSPITAL_COMMUNITY)
Admission: EM | Admit: 2021-09-15 | Discharge: 2021-09-15 | Disposition: A | Discharge status: Home / Self Care | Payer: Medicare Other | Attending: Emergency Medicine | Admitting: Emergency Medicine

## 2021-09-15 DIAGNOSIS — Z7982 Long term (current) use of aspirin: Secondary | ICD-10-CM | POA: Insufficient documentation

## 2021-09-15 DIAGNOSIS — Z794 Long term (current) use of insulin: Secondary | ICD-10-CM | POA: Diagnosis not present

## 2021-09-15 DIAGNOSIS — Z79899 Other long term (current) drug therapy: Secondary | ICD-10-CM | POA: Insufficient documentation

## 2021-09-15 DIAGNOSIS — Q2733 Arteriovenous malformation of digestive system vessel: Secondary | ICD-10-CM | POA: Diagnosis not present

## 2021-09-15 DIAGNOSIS — E119 Type 2 diabetes mellitus without complications: Secondary | ICD-10-CM | POA: Diagnosis not present

## 2021-09-15 DIAGNOSIS — I10 Essential (primary) hypertension: Secondary | ICD-10-CM | POA: Diagnosis not present

## 2021-09-15 DIAGNOSIS — D649 Anemia, unspecified: Secondary | ICD-10-CM | POA: Insufficient documentation

## 2021-09-15 DIAGNOSIS — K5520 Angiodysplasia of colon without hemorrhage: Secondary | ICD-10-CM

## 2021-09-15 DIAGNOSIS — Z7984 Long term (current) use of oral hypoglycemic drugs: Secondary | ICD-10-CM | POA: Diagnosis not present

## 2021-09-15 DIAGNOSIS — R0602 Shortness of breath: Secondary | ICD-10-CM | POA: Diagnosis present

## 2021-09-15 LAB — BASIC METABOLIC PANEL
Anion gap: 8 (ref 5–15)
BUN: 23 mg/dL (ref 8–23)
CO2: 27 mmol/L (ref 22–32)
Calcium: 10.1 mg/dL (ref 8.9–10.3)
Chloride: 97 mmol/L — ABNORMAL LOW (ref 98–111)
Creatinine, Ser: 1.38 mg/dL — ABNORMAL HIGH (ref 0.44–1.00)
GFR, Estimated: 39 mL/min — ABNORMAL LOW (ref >60)
Glucose, Bld: 175 mg/dL — ABNORMAL HIGH (ref 70–99)
Potassium: 3.9 mmol/L (ref 3.5–5.1)
Sodium: 132 mmol/L — ABNORMAL LOW (ref 135–145)

## 2021-09-15 LAB — CBC WITH DIFFERENTIAL/PLATELET
Abs Immature Granulocytes: 0.03 10*3/uL (ref 0.00–0.07)
Basophils Absolute: 0 10*3/uL (ref 0.0–0.1)
Basophils Relative: 0 %
Eosinophils Absolute: 0.3 10*3/uL (ref 0.0–0.5)
Eosinophils Relative: 4 %
HCT: 24.5 % — ABNORMAL LOW (ref 36.0–46.0)
Hemoglobin: 6.9 g/dL — CL (ref 12.0–15.0)
Immature Granulocytes: 0 %
Lymphocytes Relative: 30 %
Lymphs Abs: 2.6 10*3/uL (ref 0.7–4.0)
MCH: 19.7 pg — ABNORMAL LOW (ref 26.0–34.0)
MCHC: 28.2 g/dL — ABNORMAL LOW (ref 30.0–36.0)
MCV: 69.8 fL — ABNORMAL LOW (ref 80.0–100.0)
Monocytes Absolute: 1.3 10*3/uL — ABNORMAL HIGH (ref 0.1–1.0)
Monocytes Relative: 16 %
Neutro Abs: 4.3 10*3/uL (ref 1.7–7.7)
Neutrophils Relative %: 50 %
Platelets: 355 10*3/uL (ref 150–400)
RBC: 3.51 MIL/uL — ABNORMAL LOW (ref 3.87–5.11)
RDW: 16.9 % — ABNORMAL HIGH (ref 11.5–15.5)
WBC: 8.5 10*3/uL (ref 4.0–10.5)
nRBC: 0 % (ref 0.0–0.2)

## 2021-09-15 LAB — PREPARE RBC (CROSSMATCH)

## 2021-09-15 LAB — ABO/RH: ABO/RH(D): O POS

## 2021-09-15 MED ORDER — SODIUM CHLORIDE 0.9 % IV SOLN
10.0000 mL/h | Freq: Once | INTRAVENOUS | Status: AC
Start: 2021-09-15 — End: 2021-09-15
  Administered 2021-09-15: 10 mL/h via INTRAVENOUS

## 2021-09-15 NOTE — ED Provider Notes (Signed)
[facility]-EMERGENCY DEPT Provider Note   CSN: [id number] Arrival date & time: 09/15/21  1058     History  Chief Complaint  Patient presents with   Shortness of Breath   Low Hemoglobin    Lisa Alexander is a 81 y.o. female.  Patient is an 81 year old female with a history of hypertension, GERD, diabetes, anemia with recent referral being made for the patient to see Dr. Dinah Beers at [facility] for Spirus study for ablation of the small bowel AVMs noted on the VCE who last had a hemoglobin of 6.9 and reports she has been feeling easily fatigued, mild shortness of breath with exertion and just generally tired with cravings for ice for several months.  She reports her doctor called her today and told her to come to the emergency room due to the low blood counts.  She denies any significant change in the last week from what she has been feeling in the last few months.  She does take an aspirin but no other anticoagulation.  She has not had new bloody stools or emesis.  She has been eating and drinking per usual.   Shortness of Breath     Home Medications Prior to Admission medications   Medication Sig Start Date End Date Taking? Authorizing Provider  acetaminophen (TYLENOL) 500 MG tablet Take 1,000 mg by mouth in the morning and at bedtime.   Yes [provider]  amLODipine (NORVASC) 10 MG tablet Take 10 mg by mouth every evening.   Yes [provider]  aspirin 81 MG tablet Take 81 mg by mouth every evening.   Yes [provider]  atenolol (TENORMIN) 50 MG tablet Take 50 mg by mouth 2 (two) times daily.   Yes [provider]  budesonide-formoterol (SYMBICORT) 80-4.5 MCG/ACT inhaler Inhale 1 puff into the lungs 2 (two) times daily as needed (sob and wheezing).   Yes [provider]  cetirizine (ZYRTEC) 10 MG tablet Take 10 mg by mouth daily as needed for allergies.   Yes [provider]  chlorthalidone  (HYGROTON) 25 MG tablet Take 25 mg by mouth daily. 08/03/21  Yes [provider]  cholecalciferol (VITAMIN D3) 25 MCG (1000 UNIT) tablet Take 2,000 Units by mouth in the morning and at bedtime.   Yes [provider]  ciclopirox (PENLAC) 8 % solution Apply 1 application topically daily as needed (toe nails). 05/26/20  Yes [provider]  Cinnamon 500 MG TABS Take 500 mg by mouth in the morning and at bedtime.   Yes [provider]  guaiFENesin (MUCINEX) 600 MG 12 hr tablet Take 600 mg by mouth daily.   Yes [provider]  LANTUS SOLOSTAR 100 UNIT/ML Solostar Pen Inject 25 Units into the skin at bedtime. 09/04/21  Yes [provider]  Menthol, Topical Analgesic, (BIOFREEZE) 4 % GEL Apply 1 application topically daily as needed (pain).   Yes [provider]  metFORMIN (GLUCOPHAGE) 500 MG tablet Take 500 mg by mouth 2 (two) times daily with a meal.   Yes [provider]  omeprazole (PRILOSEC) 40 MG capsule Take 40 mg by mouth daily.   Yes [provider]  OVER THE COUNTER MEDICATION Take 1 spray by mouth as needed (coughing). Biocidin Throat spray   Yes [provider]  simvastatin (ZOCOR) 20 MG tablet Take 20 mg by mouth every evening.   Yes [provider]  Turmeric Curcumin 500 MG CAPS Take 500 mg by mouth  in the morning and at bedtime.   Yes [provider]  vitamin B-12 (CYANOCOBALAMIN) 1000 MCG tablet Take 1,000 mcg by mouth daily.   Yes [provider]  Vitamin E 670 MG (1000 UT) CAPS Take 1,000 Units by mouth daily.   Yes [provider]      Allergies    Codeine, Benzyl alcohol, and Promethazine    Review of Systems   Review of Systems  Respiratory:  Positive for shortness of breath.    Physical Exam Updated Vital Signs BP (!) 143/60   Pulse 65   Temp 98.2 F (36.8 C) (Oral)   Resp 16   Ht 5\' 3"  (1.6 m)   Wt 88 kg   SpO2 96%   BMI 34.37 kg/m  Physical  Exam Vitals and nursing note reviewed.  Constitutional:      General: She is not in acute distress.    Appearance: She is well-developed.  HENT:     Head: Normocephalic and atraumatic.  Eyes:     Pupils: Pupils are equal, round, and reactive to light.     Comments: Pale conjunctive a  Cardiovascular:     Rate and Rhythm: Normal rate and regular rhythm.     Heart sounds: Murmur heard.    No friction rub.     Comments: 2 out of 6 systolic flow murmur Pulmonary:     Effort: Pulmonary effort is normal.     Breath sounds: Normal breath sounds. No wheezing or rales.  Abdominal:     General: Bowel sounds are normal. There is no distension.     Palpations: Abdomen is soft.     Tenderness: There is no abdominal tenderness. There is no guarding or rebound.  Musculoskeletal:        General: No tenderness. Normal range of motion.     Right lower leg: No edema.     Left lower leg: No edema.     Comments: No edema  Skin:    General: Skin is warm and dry.     Coloration: Skin is pale.     Findings: No rash.  Neurological:     Mental Status: She is alert and oriented to person, place, and time. Mental status is at baseline.     Cranial Nerves: No cranial nerve deficit.  Psychiatric:        Mood and Affect: Mood normal.        Behavior: Behavior normal.    ED Results / Procedures / Treatments   Labs (all labs ordered are listed, but only abnormal results are displayed) Labs Reviewed  CBC WITH DIFFERENTIAL/PLATELET - Abnormal; Notable for the following components:      Result Value   RBC 3.51 (*)    Hemoglobin 6.9 (*)    HCT 24.5 (*)    MCV 69.8 (*)    MCH 19.7 (*)    MCHC 28.2 (*)    RDW 16.9 (*)    Monocytes Absolute 1.3 (*)    All other components within normal limits  BASIC METABOLIC PANEL - Abnormal; Notable for the following components:   Sodium 132 (*)    Chloride 97 (*)    Glucose, Bld 175 (*)    Creatinine, Ser 1.38 (*)    GFR, Estimated 39 (*)    All other  components within normal limits  TYPE AND SCREEN  PREPARE RBC (CROSSMATCH)  ABO/RH    EKG None  Radiology No results found.  Procedures Procedures  Medications Ordered in ED Medications  0.9 %  sodium chloride infusion (10 mL/hr Intravenous New Bag/Given 09/15/21 1331)    ED Course/ Medical Decision Making/ A&P                           Medical Decision Making Amount and/or Complexity of Data Reviewed External Data Reviewed: notes.    Details: gi notes Labs: ordered. Decision-making details documented in ED Course.   Pt with multiple medical problems and comorbidities and presenting today with a complaint that caries a high risk for morbidity and mortality.  Patient is presenting today because she was called by her PCP telling her her blood counts were low and she should go to the emergency room for blood transfusion.  Loss of blood is known from a small bowel AVM which patient is getting [facility].  Dr. Loreta AveMann saw the patient on 09/12/2021 and at that time hemoglobin was 6.9 and she was sending the patient back to her PCP Dr. Renae GlossShelton for either iron transfusion or blood transfusion before patient went for the above procedure.  Patient reports she continues to feel tired with exertional dyspnea but has no significant changes in the last week.  She is hemodynamically stable on exam satting at 100% and in no acute distress.  We will recheck hemoglobin levels as well as a type and screen.  I independently interpreted patient's labs today and repeat Hb is 6.9 today which was the same from ealier this week.  Dropped 1 gram since last month and feel this is a slow drop.  Will plan on transfusing the pt here and then d/c home with planned f/u.  No indication for admission today.         Final Clinical Impression(s) / ED Diagnoses Final diagnoses:  Symptomatic anemia  AVM (arteriovenous malformation) of small bowel, acquired    Rx / DC Orders ED  Discharge Orders     None         Gwyneth SproutPlunkett, Kaysan Peixoto, MD 09/15/21 1636

## 2021-09-15 NOTE — Discharge Instructions (Signed)
If you start having blood or really black stools, passing out or severe shortness of breath or chest pain you should return to the ER.

## 2021-09-15 NOTE — ED Triage Notes (Signed)
Patient coming from home with c/o SOB with exertion x 3 months. Pt also said that she was sent by her Dr (Dr. Renae Gloss and Dr. Loreta Ave) d/t low hemoglobin to get a blood transfusion. Pt also states that she has been feeling more tired the last little while.

## 2021-09-15 NOTE — ED Notes (Signed)
I provided reinforced discharge education based off of discharge instructions. Pt acknowledged and understood my education. Pt had no further questions/concerns for provider/myself.  °

## 2021-09-17 LAB — TYPE AND SCREEN
ABO/RH(D): O POS
Antibody Screen: NEGATIVE
Unit division: 0
Unit division: 0

## 2021-09-17 LAB — BPAM RBC
Blood Product Expiration Date: 202306242359
Blood Product Expiration Date: 202306242359
ISSUE DATE / TIME: 202305261246
ISSUE DATE / TIME: 202305261622
Unit Type and Rh: 5100
Unit Type and Rh: 5100

## 2021-11-20 ENCOUNTER — Ambulatory Visit (INDEPENDENT_AMBULATORY_CARE_PROVIDER_SITE_OTHER): Payer: Medicare Other | Admitting: Podiatry

## 2021-11-20 ENCOUNTER — Encounter: Payer: Self-pay | Admitting: Podiatry

## 2021-11-20 DIAGNOSIS — Q2733 Arteriovenous malformation of digestive system vessel: Secondary | ICD-10-CM | POA: Insufficient documentation

## 2021-11-20 DIAGNOSIS — E1142 Type 2 diabetes mellitus with diabetic polyneuropathy: Secondary | ICD-10-CM

## 2021-11-20 DIAGNOSIS — M79674 Pain in right toe(s): Secondary | ICD-10-CM

## 2021-11-20 DIAGNOSIS — B351 Tinea unguium: Secondary | ICD-10-CM

## 2021-11-20 DIAGNOSIS — M79675 Pain in left toe(s): Secondary | ICD-10-CM

## 2021-11-21 NOTE — Progress Notes (Signed)
  Subjective:  Patient ID: Lisa Alexander, female    DOB: July 02, 1940,  MRN: [id number]  Lisa Alexander presents to clinic today for at risk foot care with history of diabetic neuropathy and painful elongated mycotic toenails 1-5 bilaterally which are tender when wearing enclosed shoe gear. Pain is relieved with periodic professional debridement.  Last known HgA1c was unknown.  Patient did not check blood glucose today.  New problem(s): None.   PCP is [name], MD , and last visit was June, 2023.  Allergies  Allergen Reactions   Codeine Nausea And Vomiting    Other reaction(s): Dizziness, GI intolerance, Vomiting   Benzyl Alcohol     Other reaction(s): Sedation    Promethazine Other (See Comments)    Other reaction(s):  Sedation    Review of Systems: Negative except as noted in the HPI.  Objective: No changes noted in today's physical examination. There were no vitals filed for this visit.  [name] is a pleasant 81 y.o. female in NAD. AAO X 3.  Vascular Examination: CFT <3 seconds b/l LE. Palpable DP pulse(s) b/l LE. Faintly palpable PT pulse(s) b/l LE. Pedal hair absent. No pain with calf compression b/l. Lower extremity skin temperature gradient within normal limits. No edema noted b/l LE. No cyanosis or clubbing noted b/l LE.  Dermatological Examination: Pedal integument with normal turgor, texture and tone b/l LE. No open wounds b/l. No interdigital macerations b/l. Toenails 1-5 b/l elongated, thickened, discolored with subungual debris. +Tenderness with dorsal palpation of nailplates. No hyperkeratotic or porokeratotic lesions present.  Neurological Examination: Pt has subjective symptoms of neuropathy. Protective sensation intact 5/5 intact bilaterally with 10g monofilament b/l. Vibratory sensation intact b/l.  Musculoskeletal Examination: Muscle strength 5/5 to all lower extremity muscle groups bilaterally. No pain, crepitus or joint limitation noted with ROM  bilateral LE. Hammertoe(s) noted to the bilateral 5th toes. Uses walking stick for ambulation assistance.  Assessment/Plan: 1. Pain due to onychomycosis of toenails of both feet   2. Diabetic peripheral neuropathy (HCC)      -Examined patient. -Stressed the importance of good glycemic control and the detriment of not  controlling glucose levels in relation to the foot. -Patient to continue soft, supportive shoe gear daily. -Toenails 1-5 b/l were debrided in length and girth with sterile nail nippers and dremel without iatrogenic bleeding.  -Patient/POA to call should there be question/concern in the interim.   Return in about 3 months (around 02/20/2022).  Freddie Breech, DPM

## 2022-02-21 ENCOUNTER — Ambulatory Visit (INDEPENDENT_AMBULATORY_CARE_PROVIDER_SITE_OTHER): Payer: Medicare Other | Admitting: Podiatry

## 2022-02-21 ENCOUNTER — Encounter: Payer: Self-pay | Admitting: Podiatry

## 2022-02-21 DIAGNOSIS — M79674 Pain in right toe(s): Secondary | ICD-10-CM

## 2022-02-21 DIAGNOSIS — M79675 Pain in left toe(s): Secondary | ICD-10-CM | POA: Diagnosis not present

## 2022-02-21 DIAGNOSIS — E1142 Type 2 diabetes mellitus with diabetic polyneuropathy: Secondary | ICD-10-CM

## 2022-02-21 DIAGNOSIS — B351 Tinea unguium: Secondary | ICD-10-CM | POA: Diagnosis not present

## 2022-02-21 NOTE — Progress Notes (Signed)
  Subjective:  Patient ID: Lisa Alexander, female    DOB: 1940/12/15,  MRN: [id number]  Lisa Alexander presents to clinic today for at risk diabetic foot care with h/o diabetic neuropathy. Chief Complaint  Patient presents with   Nail Problem    Diabetic foot care BS-did not check today A1C-do not know PCP-Kimberley Karlton Lemon PCP VST- 2 months ago   New problem(s): None.   PCP is Willey Blade, MD.  Allergies  Allergen Reactions   Codeine Nausea And Vomiting    Other reaction(s): Dizziness, GI intolerance, Vomiting   Benzyl Alcohol     Other reaction(s): Sedation    Promethazine Other (See Comments)    Other reaction(s):  Sedation    Review of Systems: Negative except as noted in the HPI.  Objective: No changes noted in today's physical examination.  Lisa Alexander is a pleasant 81 y.o. female WD, WN in NAD. AAO x 3.  Vascular Examination: CFT <3 seconds b/l LE. Palpable DP pulse(s) b/l LE. Faintly palpable PT pulse(s) b/l LE. Pedal hair absent. No pain with calf compression b/l. Lower extremity skin temperature gradient within normal limits. No edema noted b/l LE. No cyanosis or clubbing noted b/l LE.  Dermatological Examination: Pedal integument with normal turgor, texture and tone b/l LE. No open wounds b/l. No interdigital macerations b/l. Toenails 1-5 b/l elongated, thickened, discolored with subungual debris. +Tenderness with dorsal palpation of nailplates. No hyperkeratotic or porokeratotic lesions present.  Neurological Examination: Pt has subjective symptoms of neuropathy. Protective sensation intact 5/5 intact bilaterally with 10g monofilament b/l. Vibratory sensation intact b/l.  Musculoskeletal Examination: Muscle strength 5/5 to all lower extremity muscle groups bilaterally. No pain, crepitus or joint limitation noted with ROM bilateral LE. Hammertoe(s) noted to the bilateral 5th toes. Uses walking stick for ambulation assistance.  Assessment/Plan: 1. Pain due  to onychomycosis of toenails of both feet   2. Diabetic peripheral neuropathy (HCC)     No orders of the defined types were placed in this encounter.   -Patient was evaluated and treated. All patient's and/or POA's questions/concerns answered on today's visit. -Continue diabetic foot care principles: inspect feet daily, monitor glucose as recommended by PCP and/or Endocrinologist, and follow prescribed diet per PCP, Endocrinologist and/or dietician. -Mycotic toenails 1-5 bilaterally were debrided in length and girth with sterile nail nippers and dremel without incident. -Patient/POA to call should there be question/concern in the interim.   Return in about 3 months (around 05/24/2022).  Marzetta Board, DPM

## 2022-05-04 ENCOUNTER — Other Ambulatory Visit: Payer: Self-pay

## 2022-05-04 ENCOUNTER — Encounter (HOSPITAL_COMMUNITY): Payer: Self-pay

## 2022-05-04 ENCOUNTER — Emergency Department (HOSPITAL_COMMUNITY)
Admission: EM | Admit: 2022-05-04 | Discharge: 2022-05-04 | Disposition: A | Discharge status: Home / Self Care | Payer: 59 | Attending: Student | Admitting: Student

## 2022-05-04 ENCOUNTER — Emergency Department (HOSPITAL_COMMUNITY): Payer: 59

## 2022-05-04 DIAGNOSIS — I10 Essential (primary) hypertension: Secondary | ICD-10-CM | POA: Insufficient documentation

## 2022-05-04 DIAGNOSIS — Z7984 Long term (current) use of oral hypoglycemic drugs: Secondary | ICD-10-CM | POA: Insufficient documentation

## 2022-05-04 DIAGNOSIS — R5383 Other fatigue: Secondary | ICD-10-CM | POA: Diagnosis present

## 2022-05-04 DIAGNOSIS — E119 Type 2 diabetes mellitus without complications: Secondary | ICD-10-CM | POA: Diagnosis not present

## 2022-05-04 DIAGNOSIS — D649 Anemia, unspecified: Secondary | ICD-10-CM

## 2022-05-04 DIAGNOSIS — Z79899 Other long term (current) drug therapy: Secondary | ICD-10-CM | POA: Insufficient documentation

## 2022-05-04 DIAGNOSIS — Z7982 Long term (current) use of aspirin: Secondary | ICD-10-CM | POA: Diagnosis not present

## 2022-05-04 LAB — IRON AND TIBC
Iron: 15 ug/dL — ABNORMAL LOW (ref 28–170)
Saturation Ratios: 3 % — ABNORMAL LOW (ref 10.4–31.8)
TIBC: 454 ug/dL — ABNORMAL HIGH (ref 250–450)
UIBC: 439 ug/dL

## 2022-05-04 LAB — BASIC METABOLIC PANEL
Anion gap: 10 (ref 5–15)
BUN: 21 mg/dL (ref 8–23)
CO2: 26 mmol/L (ref 22–32)
Calcium: 10 mg/dL (ref 8.9–10.3)
Chloride: 92 mmol/L — ABNORMAL LOW (ref 98–111)
Creatinine, Ser: 1.21 mg/dL — ABNORMAL HIGH (ref 0.44–1.00)
GFR, Estimated: 45 mL/min — ABNORMAL LOW (ref >60)
Glucose, Bld: 137 mg/dL — ABNORMAL HIGH (ref 70–99)
Potassium: 3.4 mmol/L — ABNORMAL LOW (ref 3.5–5.1)
Sodium: 128 mmol/L — ABNORMAL LOW (ref 135–145)

## 2022-05-04 LAB — CBC
HCT: 26.2 % — ABNORMAL LOW (ref 36.0–46.0)
Hemoglobin: 7 g/dL — ABNORMAL LOW (ref 12.0–15.0)
MCH: 17.7 pg — ABNORMAL LOW (ref 26.0–34.0)
MCHC: 26.7 g/dL — ABNORMAL LOW (ref 30.0–36.0)
MCV: 66.2 fL — ABNORMAL LOW (ref 80.0–100.0)
Platelets: 470 10*3/uL — ABNORMAL HIGH (ref 150–400)
RBC: 3.96 MIL/uL (ref 3.87–5.11)
RDW: 17.7 % — ABNORMAL HIGH (ref 11.5–15.5)
WBC: 6.9 10*3/uL (ref 4.0–10.5)
nRBC: 0 % (ref 0.0–0.2)

## 2022-05-04 LAB — PREPARE RBC (CROSSMATCH)

## 2022-05-04 LAB — FERRITIN: Ferritin: 5 ng/mL — ABNORMAL LOW (ref 11–307)

## 2022-05-04 MED ORDER — SODIUM CHLORIDE 0.9% IV SOLUTION: Freq: Once | INTRAVENOUS | Status: AC

## 2022-05-04 NOTE — ED Provider Triage Note (Signed)
Emergency Medicine Provider Triage Evaluation Note  Lisa Alexander , a 82 y.o. female  was evaluated in triage.  Pt complains of abnormal lab result.  Patient reports that "sometimes ago" she has been feeling weak.  Patient reports that this prompted her to go to her doctor on Monday and have labs drawn.  Patient reports on Tuesday she was notified that her hemoglobin was low and was directed to report to ED.  Patient reports she does have a history of blood transfusions in the past.  Patient endorsing shortness of breath, weakness however denies lightheadedness or dizziness.  Patient denies chest pain, abdominal pain, nausea or vomiting.  Patient denies blood in stool, blood in urine, denies blood thinners.  Review of Systems  Positive:  Negative:   Physical Exam  BP (!) 135/55 (BP Location: Left Arm)   Pulse 78   Temp 97.9 F (36.6 C)   Resp 16   Ht 5\' 3"  (1.6 m)   Wt 82.1 kg   SpO2 99%   BMI 32.06 kg/m  Gen:   Awake, no distress   Resp:  Normal effort  MSK:   Moves extremities without difficulty  Other:    Medical Decision Making  Medically screening exam initiated at 9:34 AM.  Appropriate orders placed.  Lisa Alexander was informed that the remainder of the evaluation will be completed by another provider, this initial triage assessment does not replace that evaluation, and the importance of remaining in the ED until their evaluation is complete.     Lisa Cecil, PA-C 05/04/22 [phone]

## 2022-05-04 NOTE — ED Triage Notes (Signed)
Patient states she was called by her [facility] today and told to come to the ED because she had a low Hgb level. Patient does c/o weakness.

## 2022-05-04 NOTE — ED Provider Notes (Signed)
  Physical Exam  BP (!) 126/54   Pulse 68   Temp 98.2 F (36.8 C) (Oral)   Resp 15   Ht 5\' 3"  (1.6 m)   Wt 82.1 kg   SpO2 98%   BMI 32.06 kg/m   Physical Exam  Procedures  Procedures  ED Course / MDM    Medical Decision Making Amount and/or Complexity of Data Reviewed Labs: ordered.  Risk Prescription drug management.   Received patient in signout.  Anemia.  History of same.  Has had previous GI bleeds.  Hemoglobin down to 7.  Transfusion feels much better.  Has seen Dr. Collene Mares from GI in the past.  Can follow-up PCP and GI does not need to need admission to the hospital.  Has mild hyponatremia that could also be followed.       Davonna Belling, MD 05/04/22 2059

## 2022-05-04 NOTE — ED Provider Notes (Signed)
[facility]-EMERGENCY DEPT Provider Note  CSN: [id number] Arrival date & time: 05/04/22 0920  Chief Complaint(s) Abnormal Lab  HPI Lisa Alexander is a 82 y.o. female with PMH gastric AVM, T2DM, iron deficiency anemia with baseline hemoglobin around 9.5 or 10 who presents emergency department for evaluation of anemia and fatigue.  Patient is being followed by Dr. Loreta Ave of gastroenterology and was sent for an EGD to [facility] where she was diagnosed with a AVM.  She states that she has been gradually declining in her functional ability with increasing fatigue for the last 2 months and had blood work performed by her primary care physician that showed hemoglobin dropped to 6.8 and she was encouraged to come to the emergency department for blood transfusion.  She currently denies chest pain, shortness of breath, headache, fever or other systemic symptoms.  Denies dark stools.   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) 11/20/2021   Gastroesophageal reflux disease without esophagitis 01/31/2021   Vitamin D deficiency 01/31/2021   Overactive bladder 07/18/2020   Obesity with body mass index 30 or greater 07/18/2020   Allergic rhinitis 07/18/2020   Anemia 07/18/2020   Osteoarthritis 07/18/2020   Benign essential hypertension 07/18/2020   Constipation 07/18/2020   Dysphagia 07/18/2020   Diabetic peripheral neuropathy (HCC) 07/06/2020   Hyperlipidemia 04/12/2020   Hypertension 04/12/2020   Primary localized osteoarthritis of right knee 04/12/2020   AKI (acute kidney injury) (HCC) 07/07/2018   Calculus of gallbladder without cholecystitis without obstruction 07/02/2018   Hyponatremia 07/02/2018   Acute  cystitis without hematuria 06/30/2018   Hypertensive urgency 06/30/2018   Intractable nausea and vomiting 06/30/2018   Hypercalcemia 06/30/2018   Disorder of breast 06/21/2015   Glaucoma suspect of both eyes 03/26/2011   Hypermetropia 03/26/2011   Nuclear senile cataract 03/26/2011   Presbyopia 03/26/2011   Type 2 diabetes mellitus without complications (HCC) 03/26/2011   Home Medication(s) Prior to Admission medications   Medication Sig Start Date End Date Taking? Authorizing Provider  acetaminophen (TYLENOL) 500 MG tablet Take 1,000 mg by mouth in the morning and at bedtime.    [provider]  amLODipine (NORVASC) 10 MG tablet Take 10 mg by mouth every evening.    [provider]  aspirin 81 MG tablet Take 81 mg by mouth every evening.    [provider]  atenolol (TENORMIN) 50 MG tablet Take 50 mg by mouth 2 (two) times daily.    [provider]  budesonide-formoterol (SYMBICORT) 80-4.5 MCG/ACT inhaler Inhale 1 puff into the lungs 2 (two) times daily as needed (sob and wheezing).    [provider]  cetirizine (ZYRTEC) 10 MG tablet Take 10 mg by mouth daily as needed for allergies.    [provider]  chlorthalidone (HYGROTON) 25 MG tablet Take 25 mg by mouth daily. 08/03/21   [provider]  cholecalciferol (VITAMIN D3) 25 MCG (1000 UNIT) tablet Take 2,000 Units by mouth in the morning and at bedtime.    [provider]  ciclopirox (PENLAC) 8 % solution Apply 1 application topically daily as needed (toe nails). 05/26/20   [provider]  Cinnamon 500 MG TABS Take 500 mg by mouth in  the morning and at bedtime.    [provider]  guaiFENesin (MUCINEX) 600 MG 12 hr tablet Take 600 mg by mouth daily.    [provider]  LANTUS SOLOSTAR 100 UNIT/ML Solostar Pen Inject 25 Units into the skin at bedtime. 09/04/21   [provider]  Menthol, Topical Analgesic, (BIOFREEZE) 4 % GEL Apply 1  application topically daily as needed (pain).    [provider]  metFORMIN (GLUCOPHAGE) 500 MG tablet Take 500 mg by mouth 2 (two) times daily with a meal.    [provider]  omeprazole (PRILOSEC) 40 MG capsule Take 40 mg by mouth daily.    [provider]  OVER THE COUNTER MEDICATION Take 1 spray by mouth as needed (coughing). Biocidin Throat spray    [provider]  simvastatin (ZOCOR) 20 MG tablet Take 20 mg by mouth every evening.    [provider]  Turmeric Curcumin 500 MG CAPS Take 500 mg by mouth in the morning and at bedtime.    [provider]  vitamin B-12 (CYANOCOBALAMIN) 1000 MCG tablet Take 1,000 mcg by mouth daily.    [provider]  Vitamin E 670 MG (1000 UT) CAPS Take 1,000 Units by mouth daily.    [provider]                                                                                                                                    Past Surgical History Past Surgical History:  Procedure Laterality Date   BIOPSY  05/05/2021   Procedure: BIOPSY;  Surgeon: Jeani Hawking, MD;  Location: [facility];  Service: Endoscopy;;   BREAST LUMPECTOMY WITH RADIOACTIVE SEED LOCALIZATION Right 06/21/2015   Procedure: BREAST LUMPECTOMY WITH RADIOACTIVE SEED LOCALIZATION;  Surgeon: Harriette Bouillon, MD;  Location: [facility];  Service: General;  Laterality: Right;   COLONOSCOPY WITH PROPOFOL N/A 05/05/2021   Procedure: COLONOSCOPY WITH PROPOFOL;  Surgeon: Jeani Hawking, MD;  Location: [facility];  Service: Endoscopy;  Laterality: N/A;   ENTEROSCOPY N/A 08/18/2021   Procedure: ENTEROSCOPY;  Surgeon: Jeani Hawking, MD;  Location: [facility];  Service: Gastroenterology;  Laterality: N/A;   ESOPHAGOGASTRODUODENOSCOPY (EGD) WITH PROPOFOL N/A 05/05/2021   Procedure: ESOPHAGOGASTRODUODENOSCOPY (EGD) WITH PROPOFOL;  Surgeon: Jeani Hawking, MD;  Location: [facility];  Service: Endoscopy;  Laterality: N/A;    GIVENS CAPSULE STUDY N/A 08/07/2021   Procedure: GIVENS CAPSULE STUDY;  Surgeon: Charna Elizabeth, MD;  Location: [facility];  Service: Gastroenterology;  Laterality: N/A;   JOINT REPLACEMENT Left    POLYPECTOMY  05/05/2021   Procedure: POLYPECTOMY;  Surgeon: Jeani Hawking, MD;  Location: [facility];  Service: Endoscopy;;   TONSILLECTOMY     TUBAL LIGATION     Family History History reviewed. No pertinent family history.  Social History Social History   Tobacco Use   Smoking status: Never   Smokeless tobacco: Never  Vaping Use  Vaping Use: Never used  Substance Use Topics   Alcohol use: No   Drug use: No   Allergies Codeine, Benzyl alcohol, and Promethazine  Review of Systems Review of Systems  Constitutional:  Positive for fatigue.    Physical Exam Vital Signs  I have reviewed the triage vital signs BP (!) 141/64 (BP Location: Left Arm)   Pulse 61   Temp (!) 97.4 F (36.3 C) (Oral)   Resp 15   Ht 5\' 3"  (1.6 m)   Wt 82.1 kg   SpO2 100%   BMI 32.06 kg/m   Physical Exam Vitals and nursing note reviewed.  Constitutional:      General: She is not in acute distress.    Appearance: She is well-developed.  HENT:     Head: Normocephalic and atraumatic.  Eyes:     Conjunctiva/sclera: Conjunctivae normal.  Cardiovascular:     Rate and Rhythm: Normal rate and regular rhythm.     Heart sounds: No murmur heard. Pulmonary:     Effort: Pulmonary effort is normal. No respiratory distress.     Breath sounds: Normal breath sounds.  Abdominal:     Palpations: Abdomen is soft.     Tenderness: There is no abdominal tenderness.  Musculoskeletal:        General: No swelling.     Cervical back: Neck supple.  Skin:    General: Skin is warm and dry.     Capillary Refill: Capillary refill takes less than 2 seconds.  Neurological:     Mental Status: She is alert.  Psychiatric:        Mood and Affect: Mood normal.     ED Results and Treatments Labs (all labs ordered  are listed, but only abnormal results are displayed) Labs Reviewed  CBC - Abnormal; Notable for the following components:      Result Value   Hemoglobin 7.0 (*)    HCT 26.2 (*)    MCV 66.2 (*)    MCH 17.7 (*)    MCHC 26.7 (*)    RDW 17.7 (*)    Platelets 470 (*)    All other components within normal limits  BASIC METABOLIC PANEL - Abnormal; Notable for the following components:   Sodium 128 (*)    Potassium 3.4 (*)    Chloride 92 (*)    Glucose, Bld 137 (*)    Creatinine, Ser 1.21 (*)    GFR, Estimated 45 (*)    All other components within normal limits  TYPE AND SCREEN                                                                                                                          Radiology DG Chest 2 View  Result Date: 05/04/2022 CLINICAL DATA:  sob EXAM: CHEST - 2 VIEW COMPARISON:  None available. FINDINGS: The heart size and mediastinal contours are within normal limits. Both lungs are clear. No pneumothorax or pleural effusion. Aorta is calcified. There  are thoracic degenerative changes. IMPRESSION: No active cardiopulmonary disease. Electronically Signed   By: Sammie Bench M.D.   On: 05/04/2022 09:52    Pertinent labs & imaging results that were available during my care of the patient were reviewed by me and considered in my medical decision making (see MDM for details).  Medications Ordered in ED Medications - No data to display                                                                                                                                   Procedures .Critical Care  Performed by: Teressa Lower, MD Authorized by: Teressa Lower, MD   Critical care provider statement:    Critical care time (minutes):  30   Critical care was necessary to treat or prevent imminent or life-threatening deterioration of the following conditions: Symptomatic anemia requiring blood.   Critical care was time spent personally by me on the following activities:   Development of treatment plan with patient or surrogate, discussions with consultants, evaluation of patient's response to treatment, examination of patient, ordering and review of laboratory studies, ordering and review of radiographic studies, ordering and performing treatments and interventions, pulse oximetry, re-evaluation of patient's condition and review of old charts   (including critical care time)  Medical Decision Making / ED Course   This patient presents to the ED for concern of fatigue, abnormal labs, this involves an extensive number of treatment options, and is a complaint that carries with it a high risk of complications and morbidity.  The differential diagnosis includes iron deficiency anemia, acute blood loss anemia, anemia of chronic disease, symptomatic hyponatremia  MDM: Patient seen emergency room for evaluation of fatigue and abnormal labs.  Physical exam is largely unremarkable.  Rectal exam deferred as the patient is currently taking iron and this will result in a positive fecal occult blood test regardless.  To evaluation with a hemoglobin of 7.0 with an MCV of 66.2, sodium of 128 that is downtrending from previous at 132, mild hypokalemia 3.4, creatinine 1.21.  I had a long discussion with the patient and her family and we shared decision making to address the presumed symptomatic anemia first with a blood transfusion and the patient is feeling very well after her transfusion she can follow-up outpatient with her gastroenterologist Dr. Collene Mares and her PCP to trend her sodiums.  If the patient is not feeling better after blood transfusion she will likely require admission for symptomatic hyponatremia.  At time of signout, patient pending blood transfusion.  Please see provider signout continuation of workup.   Additional history obtained: -Additional history obtained from daughter -External records from outside source obtained and reviewed including: Chart review including  previous notes, labs, imaging, consultation notes   Lab Tests: -I ordered, reviewed, and interpreted labs.   The pertinent results include:   Labs Reviewed  CBC - Abnormal; Notable for the following components:  Result Value   Hemoglobin 7.0 (*)    HCT 26.2 (*)    MCV 66.2 (*)    MCH 17.7 (*)    MCHC 26.7 (*)    RDW 17.7 (*)    Platelets 470 (*)    All other components within normal limits  BASIC METABOLIC PANEL - Abnormal; Notable for the following components:   Sodium 128 (*)    Potassium 3.4 (*)    Chloride 92 (*)    Glucose, Bld 137 (*)    Creatinine, Ser 1.21 (*)    GFR, Estimated 45 (*)    All other components within normal limits  TYPE AND SCREEN     Imaging Studies ordered: I ordered imaging studies including x-ray I independently visualized and interpreted imaging. I agree with the radiologist interpretation   Medicines ordered and prescription drug management: No orders of the defined types were placed in this encounter.   -I have reviewed the patients home medicines and have made adjustments as needed  Critical interventions Blood transfusion  Cardiac Monitoring: The patient was maintained on a cardiac monitor.  I personally viewed and interpreted the cardiac monitored which showed an underlying rhythm of: NSR  Social Determinants of Health:  Factors impacting patients care include: none   Reevaluation: After the interventions noted above, I reevaluated the patient and found that they have :stayed the same  Co morbidities that complicate the patient evaluation  Past Medical History:  Diagnosis Date   Arthritis    Diabetes mellitus without complication (HCC)    GERD (gastroesophageal reflux disease)    High cholesterol    Hypertension       Dispostion: I considered admission for this patient, and disposition pending reevaluation after blood transfusion.  Please see provider signout for continuation of workup.     Final Clinical  Impression(s) / ED Diagnoses Final diagnoses:  None     @PCDICTATION @    Teressa Lower, MD 05/04/22 2008

## 2022-05-05 LAB — TYPE AND SCREEN
ABO/RH(D): O POS
Antibody Screen: NEGATIVE
Unit division: 0

## 2022-05-05 LAB — BPAM RBC
Blood Product Expiration Date: 202402132359
ISSUE DATE / TIME: 202401121359
Unit Type and Rh: 5100

## 2022-06-18 ENCOUNTER — Ambulatory Visit (INDEPENDENT_AMBULATORY_CARE_PROVIDER_SITE_OTHER): Payer: 59 | Admitting: Podiatry

## 2022-06-18 VITALS — BP 162/63

## 2022-06-18 DIAGNOSIS — M79675 Pain in left toe(s): Secondary | ICD-10-CM | POA: Diagnosis not present

## 2022-06-18 DIAGNOSIS — E1142 Type 2 diabetes mellitus with diabetic polyneuropathy: Secondary | ICD-10-CM | POA: Diagnosis not present

## 2022-06-18 DIAGNOSIS — M79674 Pain in right toe(s): Secondary | ICD-10-CM | POA: Diagnosis not present

## 2022-06-18 DIAGNOSIS — B351 Tinea unguium: Secondary | ICD-10-CM | POA: Diagnosis not present

## 2022-06-19 ENCOUNTER — Encounter: Payer: Self-pay | Admitting: Podiatry

## 2022-06-19 DIAGNOSIS — B351 Tinea unguium: Secondary | ICD-10-CM | POA: Insufficient documentation

## 2022-06-19 NOTE — Progress Notes (Signed)
  Subjective:  Patient ID: Lisa Alexander, female    DOB: 1941/01/26,  MRN: [id number]  Lisa Alexander presents to clinic today for at risk foot care with history of diabetic neuropathy and painful thick toenails that are difficult to trim. Pain interferes with ambulation. Aggravating factors include wearing enclosed shoe gear. Pain is relieved with periodic professional debridement.  Chief Complaint  Patient presents with   Nail Problem    [facility] BS-did not check today A1C-7.0 PCP-Shelton, Kimberley PCP VST-04/2022    New problem(s): None.   PCP is Willey Blade, MD.  Allergies  Allergen Reactions   Codeine Nausea And Vomiting    Other reaction(s): Dizziness, GI intolerance, Vomiting   Benzyl Alcohol     Other reaction(s): Sedation    Promethazine Other (See Comments)    Other reaction(s):  Sedation    Review of Systems: Negative except as noted in the HPI.  Objective: No changes noted in today's physical examination. Vitals:   06/18/22 1025  BP: (!) 162/63   Lisa Alexander is a pleasant 82 y.o. female WD, WN in NAD. AAO x 3.  Vascular Examination: CFT <3 seconds b/l LE. Palpable DP pulse(s) b/l LE. Faintly palpable PT pulse(s) b/l LE. Pedal hair absent. No pain with calf compression b/l. Lower extremity skin temperature gradient within normal limits. No edema noted b/l LE. No cyanosis or clubbing noted b/l LE.  Dermatological Examination: Pedal integument with normal turgor, texture and tone b/l LE. No open wounds b/l. No interdigital macerations b/l. Toenails 1-5 b/l elongated, thickened, discolored with subungual debris. +Tenderness with dorsal palpation of nailplates. No hyperkeratotic or porokeratotic lesions present.  Neurological Examination: Pt has subjective symptoms of neuropathy. Protective sensation intact 5/5 intact bilaterally with 10g monofilament b/l. Vibratory sensation intact b/l.  Musculoskeletal Examination: Muscle strength 5/5 to all lower extremity  muscle groups bilaterally. No pain, crepitus or joint limitation noted with ROM bilateral LE. Hammertoe(s) noted to the bilateral 5th toes. Uses walking stick for ambulation assistance.  Assessment/Plan: 1. Pain due to onychomycosis of toenails of both feet   2. Diabetic peripheral neuropathy ([place])     -Consent given for treatment as described below: -Examined patient. -Continue foot and shoe inspections daily. Monitor blood glucose per PCP/Endocrinologist's recommendations. -Patient to continue soft, supportive shoe gear daily. -Toenails 1-5 b/l were debrided in length and girth with sterile nail nippers and dremel without iatrogenic bleeding.  -Patient/POA to call should there be question/concern in the interim.   Return in about 3 months (around 09/16/2022).  Marzetta Board, DPM

## 2022-10-09 ENCOUNTER — Ambulatory Visit (INDEPENDENT_AMBULATORY_CARE_PROVIDER_SITE_OTHER): Payer: 59 | Admitting: Podiatry

## 2022-10-09 VITALS — BP 139/67 | HR 51

## 2022-10-09 DIAGNOSIS — M79674 Pain in right toe(s): Secondary | ICD-10-CM

## 2022-10-09 DIAGNOSIS — M79675 Pain in left toe(s): Secondary | ICD-10-CM

## 2022-10-09 DIAGNOSIS — B351 Tinea unguium: Secondary | ICD-10-CM | POA: Diagnosis not present

## 2022-10-09 DIAGNOSIS — E1142 Type 2 diabetes mellitus with diabetic polyneuropathy: Secondary | ICD-10-CM | POA: Diagnosis not present

## 2022-10-14 ENCOUNTER — Encounter: Payer: Self-pay | Admitting: Podiatry

## 2022-10-14 NOTE — Progress Notes (Signed)
  Subjective:  Patient ID: Lisa Alexander, female    DOB: [birth year],  MRN: [id number]  Lisa Alexander presents to clinic today for at risk foot care with history of diabetic neuropathy and painful thick toenails that are difficult to trim. Pain interferes with ambulation. Aggravating factors include wearing enclosed shoe gear. Pain is relieved with periodic professional debridement.  Chief Complaint  Patient presents with   Diabetes    [facility] BS - DIDN'T CHECK IT THIS MORNING A1C - DON'T REMEMBER [facility]PCP - 06/2022   New problem(s): None.   PCP is [name], MD.  Allergies  Allergen Reactions   Codeine Nausea And Vomiting    Other reaction(s): Dizziness, GI intolerance, Vomiting   Benzyl Alcohol     Other reaction(s): Sedation    Promethazine Other (See Comments)    Other reaction(s):   Sedation    Review of Systems: Negative except as noted in the HPI.  Objective: No changes noted in today's physical examination. Vitals:   10/09/22 1030  BP: 139/67  Pulse: (!) 51   Lisa Alexander is a pleasant 82 y.o. female WD, WN in NAD. AAO x 3.  Vascular Examination: Capillary refill time immediate b/l. Vascular status intact b/l with palpable DP pulses; faintly palpable PT pulses. Pedal hair absent b/l. No edema. No pain with calf compression b/l. Skin temperature gradient WNL b/l. No cyanosis or clubbing noted b/l. No ischemia or gangrene b/l.   Neurological Examination: Sensation grossly intact b/l with 10 gram monofilament. Vibratory sensation intact b/l. Protective sensation intact 5/5 intact bilaterally with 10g monofilament b/l. Vibratory sensation intact b/l. Proprioception intact bilaterally.  Dermatological Examination: Pedal skin with normal turgor, texture and tone b/l.  No open wounds. No interdigital macerations.   Toenails 1-5 b/l thick, discolored, elongated with subungual debris and pain on dorsal palpation.   No hyperkeratotic nor porokeratotic lesions present on  today's visit.  Musculoskeletal Examination: Muscle strength 5/5 to all lower extremity muscle groups bilaterally. Hammertoe(s) noted to the L 5th toe and R 5th toe. Uses walking stick for ambulation assistance.  Radiographs: None  Assessment/Plan: 1. Pain due to onychomycosis of toenails of both feet   2. Diabetic peripheral neuropathy (HCC)   -Patient was evaluated and treated. All patient's and/or POA's questions/concerns answered on today's visit. -Patient to continue soft, supportive shoe gear daily. -Mycotic toenails 1-5 bilaterally were debrided in length and girth with sterile nail nippers and dremel without incident. -Patient/POA to call should there be question/concern in the interim.   Return in about 3 months (around 01/09/2023).  Freddie Breech, DPM

## 2023-01-23 ENCOUNTER — Encounter: Payer: Self-pay | Admitting: Podiatry

## 2023-01-23 ENCOUNTER — Ambulatory Visit (INDEPENDENT_AMBULATORY_CARE_PROVIDER_SITE_OTHER): Payer: 59 | Admitting: Podiatry

## 2023-01-23 DIAGNOSIS — M2041 Other hammer toe(s) (acquired), right foot: Secondary | ICD-10-CM

## 2023-01-23 DIAGNOSIS — M79674 Pain in right toe(s): Secondary | ICD-10-CM

## 2023-01-23 DIAGNOSIS — M79675 Pain in left toe(s): Secondary | ICD-10-CM | POA: Diagnosis not present

## 2023-01-23 DIAGNOSIS — B351 Tinea unguium: Secondary | ICD-10-CM

## 2023-01-23 DIAGNOSIS — M2042 Other hammer toe(s) (acquired), left foot: Secondary | ICD-10-CM | POA: Diagnosis not present

## 2023-01-23 DIAGNOSIS — D500 Iron deficiency anemia secondary to blood loss (chronic): Secondary | ICD-10-CM | POA: Insufficient documentation

## 2023-01-23 DIAGNOSIS — Z87828 Personal history of other (healed) physical injury and trauma: Secondary | ICD-10-CM | POA: Diagnosis not present

## 2023-01-23 DIAGNOSIS — Z794 Long term (current) use of insulin: Secondary | ICD-10-CM

## 2023-01-23 DIAGNOSIS — E119 Type 2 diabetes mellitus without complications: Secondary | ICD-10-CM | POA: Diagnosis not present

## 2023-01-23 DIAGNOSIS — K5520 Angiodysplasia of colon without hemorrhage: Secondary | ICD-10-CM | POA: Insufficient documentation
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
## 2023-05-08 ENCOUNTER — Ambulatory Visit (INDEPENDENT_AMBULATORY_CARE_PROVIDER_SITE_OTHER): Payer: 59 | Admitting: Podiatry

## 2023-05-08 ENCOUNTER — Encounter: Payer: Self-pay | Admitting: Podiatry

## 2023-05-08 DIAGNOSIS — M79674 Pain in right toe(s): Secondary | ICD-10-CM

## 2023-05-08 DIAGNOSIS — E119 Type 2 diabetes mellitus without complications: Secondary | ICD-10-CM

## 2023-05-08 DIAGNOSIS — Z794 Long term (current) use of insulin: Secondary | ICD-10-CM | POA: Diagnosis not present

## 2023-05-08 DIAGNOSIS — B351 Tinea unguium: Secondary | ICD-10-CM

## 2023-05-08 DIAGNOSIS — M79675 Pain in left toe(s): Secondary | ICD-10-CM | POA: Diagnosis not present

## 2023-05-17 NOTE — Progress Notes (Signed)
  Subjective:  Patient ID: Lisa Alexander, female    DOB: 21-Dec-1940,  MRN: [id number]  Lisa Alexander presents to clinic today for preventative diabetic foot care and painful elongated mycotic toenails 1-5 bilaterally which are tender when wearing enclosed shoe gear. Pain is relieved with periodic professional debridement.  Chief Complaint  Patient presents with   DFc    She is here for a diabetic nail trim and does not remember her A1C, PCP is Dr. Pietro Cassis, last OV was 01/2023.   New problem(s): None.   PCP is [name], MD.  Allergies  Allergen Reactions   Codeine Nausea And Vomiting    Other reaction(s): Dizziness, GI intolerance, Vomiting   Benzyl Alcohol     Other reaction(s): Sedation    Promethazine Other (See Comments)    Other reaction(s):   Sedation    Review of Systems: Negative except as noted in the HPI.  Objective: No changes noted in today's physical examination. There were no vitals filed for this visit. Lisa Alexander is a pleasant 83 y.o. female WD, WN in NAD. AAO x 3.  Vascular Examination: Capillary refill time immediate b/l. Vascular status intact b/l with palpable DP pulses; faintly palpable PT pulses. Pedal hair absent b/l. No edema. No pain with calf compression b/l. Skin temperature gradient WNL b/l. No cyanosis or clubbing noted b/l. No ischemia or gangrene b/l.   Neurological Examination: Sensation grossly intact b/l with 10 gram monofilament. Vibratory sensation intact b/l.   Dermatological Examination: Pedal skin with normal turgor, texture and tone b/l.  No open wounds. No interdigital macerations.   Toenails 1-5 b/l thick, discolored, elongated with subungual debris and pain on dorsal palpation.   Well healed scars dorsal aspect of both feet. No corns, calluses nor porokeratotic lesions noted.  Musculoskeletal Examination: Muscle strength 5/5 to all lower extremity muscle groups bilaterally. Hammertoe(s) noted to the bilateral 5th toes.  She uses walking stick for mobility assistance.  Radiographs: None  Assessment/Plan: 1. Pain due to onychomycosis of toenails of both feet   2. Type 2 diabetes mellitus without complication, with long-term current use of insulin (HCC)     -Patient was evaluated today. All questions/concerns addressed on today's visit. -Continue foot and shoe inspections daily. Monitor blood glucose per PCP/Endocrinologist's recommendations. -Mycotic toenails 1-5 bilaterally were debrided in length and girth with sterile nail nippers and dremel without incident. -Patient/POA to call should there be question/concern in the interim.   Return in about 3 months (around 08/06/2023).  Lisa Alexander, DPM      [place] LOCATION: [address], [place] [address]                   Office [phone]   [facility] LOCATION: [address] [place], [place] [address] Office [phone]

## 2023-08-28 ENCOUNTER — Ambulatory Visit (INDEPENDENT_AMBULATORY_CARE_PROVIDER_SITE_OTHER): Payer: 59 | Admitting: Podiatry

## 2023-08-28 ENCOUNTER — Encounter: Payer: Self-pay | Admitting: Podiatry

## 2023-08-28 VITALS — Ht 63.0 in | Wt 181.0 lb

## 2023-08-28 DIAGNOSIS — M79675 Pain in left toe(s): Secondary | ICD-10-CM

## 2023-08-28 DIAGNOSIS — M79674 Pain in right toe(s): Secondary | ICD-10-CM

## 2023-08-28 DIAGNOSIS — Z794 Long term (current) use of insulin: Secondary | ICD-10-CM | POA: Diagnosis not present

## 2023-08-28 DIAGNOSIS — B351 Tinea unguium: Secondary | ICD-10-CM | POA: Diagnosis not present

## 2023-08-28 DIAGNOSIS — E119 Type 2 diabetes mellitus without complications: Secondary | ICD-10-CM | POA: Diagnosis not present

## 2023-09-01 NOTE — Progress Notes (Signed)
  Subjective:  Patient ID: Lisa Alexander, female    DOB: 01/08/1941,  MRN: [id number]  Lisa Alexander presents to clinic today for preventative diabetic foot care and painful mycotic toenails x 10 which interfere with daily activities. Pain is relieved with periodic professional debridement.  Chief Complaint  Patient presents with   Nail Problem    Pt is here for [facility] unsure of last A1C PCP is Dr Hershal Loron and LOV was in November.   New problem(s): None.   PCP is Yolanda Hence, MD.  Allergies  Allergen Reactions   Codeine Nausea And Vomiting    Other reaction(s): Dizziness, GI intolerance, Vomiting   Benzyl Alcohol     Other reaction(s): Sedation    Promethazine Other (See Comments)    Other reaction(s):   Sedation    Review of Systems: Negative except as noted in the HPI.  Objective: No changes noted in today's physical examination. There were no vitals filed for this visit. Lisa Alexander is a pleasant 83 y.o. female WD, WN in NAD. AAO x 3.  Vascular Examination: Capillary refill time immediate b/l. Vascular status intact b/l with palpable DP pulses; faintly palpable PT pulses. Pedal hair absent b/l. No edema. No pain with calf compression b/l. Skin temperature gradient WNL b/l. No cyanosis or clubbing noted b/l. No ischemia or gangrene b/l.   Neurological Examination: Sensation grossly intact b/l with 10 gram monofilament. Vibratory sensation intact b/l.   Dermatological Examination: Pedal skin with normal turgor, texture and tone b/l.  No open wounds. No interdigital macerations.   Toenails 1-5 b/l thick, discolored, elongated with subungual debris and pain on dorsal palpation.   No hyperkeratotic nor porokeratotic lesions present on today's visit.  Musculoskeletal Examination: Muscle strength 5/5 to all lower extremity muscle groups bilaterally. Hammertoe(s) bilateral 5th toes. Uses walking stick for ambulation assistance.  Radiographs: None  Assessment/Plan: 1.  Pain due to onychomycosis of toenails of both feet   2. Type 2 diabetes mellitus without complication, with long-term current use of insulin  [facility])     Consent given for treatment. Patient examined. All patient's and/or POA's questions/concerns addressed on today's visit. Toenails 1-5 debrided in length and girth without incident. Continue foot and shoe inspections daily. Monitor blood glucose per PCP/Endocrinologist's recommendations. Continue soft, supportive shoe gear daily. Report any pedal injuries to medical professional. Call office if there are any questions/concerns. -Patient/POA to call should there be question/concern in the interim.   Return in about 3 months (around 11/28/2023).  Lisa Alexander, DPM      [place] LOCATION: [address] [place] [address]                   Office [phone]   [facility] LOCATION: [address] [place], [place] [address] Office [phone]

## 2023-12-04 ENCOUNTER — Ambulatory Visit (INDEPENDENT_AMBULATORY_CARE_PROVIDER_SITE_OTHER): Admitting: Podiatry

## 2023-12-04 DIAGNOSIS — Z91199 Patient's noncompliance with other medical treatment and regimen due to unspecified reason: Secondary | ICD-10-CM

## 2023-12-05 NOTE — Progress Notes (Signed)
 1. No-show for appointment

## 2023-12-17 ENCOUNTER — Encounter: Payer: Self-pay | Admitting: Podiatry

## 2023-12-17 ENCOUNTER — Ambulatory Visit (INDEPENDENT_AMBULATORY_CARE_PROVIDER_SITE_OTHER): Admitting: Podiatry

## 2023-12-17 DIAGNOSIS — E1142 Type 2 diabetes mellitus with diabetic polyneuropathy: Secondary | ICD-10-CM

## 2023-12-17 DIAGNOSIS — M79674 Pain in right toe(s): Secondary | ICD-10-CM

## 2023-12-17 DIAGNOSIS — M79675 Pain in left toe(s): Secondary | ICD-10-CM | POA: Diagnosis not present

## 2023-12-17 DIAGNOSIS — B351 Tinea unguium: Secondary | ICD-10-CM

## 2023-12-21 NOTE — Progress Notes (Signed)
  Subjective:  Patient ID: Lisa Alexander, female    DOB: [birth year],  MRN: [id number]  Lisa Alexander presents to clinic today for at risk foot care with history of diabetic neuropathy and painful thick toenails that are difficult to trim. Pain interferes with ambulation. Aggravating factors include wearing enclosed shoe gear. Pain is relieved with periodic professional debridement.  Chief Complaint  Patient presents with   [facility]    Rm17 Diabetic foot care/ Dr. Suzen Lamer last visit July 17,2025   New problem(s): None.   PCP is Lamer Suzen, MD.  Allergies  Allergen Reactions   Codeine Nausea And Vomiting    Other reaction(s): Dizziness, GI intolerance, Vomiting   Benzyl Alcohol     Other reaction(s): Sedation    Promethazine Other (See Comments)    Other reaction(s):   Sedation    Review of Systems: Negative except as noted in the HPI.  Objective: No changes noted in today's physical examination. There were no vitals filed for this visit. Lisa Alexander is a pleasant 83 y.o. female WD, WN in NAD. AAO x 3.  Vascular Examination: Capillary refill time immediate b/l. Vascular status intact b/l with palpable DP pulses; faintly palpable PT pulses. Pedal hair diminished b/l. No edema. No pain with calf compression b/l. Skin temperature gradient WNL b/l. No cyanosis or clubbing noted b/l. No ischemia or gangrene b/l.   Neurological Examination: Sensation grossly intact b/l with 10 gram monofilament. Vibratory sensation intact b/l. Pt has subjective symptoms of neuropathy.  Dermatological Examination: Pedal skin with normal turgor, texture and tone b/l.  No open wounds. No interdigital macerations.   Toenails 1-5 b/l thick, discolored, elongated with subungual debris and pain on dorsal palpation.   No corns, calluses, nor porokeratotic lesions.  Musculoskeletal Examination: Muscle strength 5/5 to all lower extremity muscle groups bilaterally. No pain, crepitus or joint  limitation noted with ROM b/l LE. Hammertoe(s) b/l 5th toes. Patient ambulates with walking stick assistance.  Radiographs: None  Assessment/Plan: 1. Pain due to onychomycosis of toenails of both feet   2. Diabetic peripheral neuropathy associated with type 2 diabetes mellitus (HCC)   Patient was evaluated and treated. All patient's and/or POA's questions/concerns addressed on today's visit. Mycotic toenails 1-5 debrided in length and girth without incident.  Continue daily foot inspections and monitor blood glucose per PCP/Endocrinologist's recommendations.Continue soft, supportive shoe gear daily. Report any pedal injuries to medical professional. Call office if there are any quesitons/concerns. -Patient/POA to call should there be question/concern in the interim.   Return in about 3 months (around 03/18/2024).  Delon LITTIE Merlin, DPM      [place] LOCATION: [address], [place] [address]                   Office [phone]   [facility] LOCATION: [address] [place], [place] [address] Office [phone]

## 2024-03-30 ENCOUNTER — Ambulatory Visit: Admitting: Diagnostic Neuroimaging

## 2024-03-30 ENCOUNTER — Telehealth: Payer: Self-pay | Admitting: Diagnostic Neuroimaging

## 2024-03-30 NOTE — Telephone Encounter (Signed)
 Request to cancel appointment due to weather

## 2024-03-31 ENCOUNTER — Encounter: Payer: Self-pay | Admitting: Podiatry

## 2024-03-31 ENCOUNTER — Ambulatory Visit: Admitting: Podiatry

## 2024-03-31 DIAGNOSIS — B351 Tinea unguium: Secondary | ICD-10-CM | POA: Diagnosis not present

## 2024-03-31 DIAGNOSIS — M79675 Pain in left toe(s): Secondary | ICD-10-CM | POA: Diagnosis not present

## 2024-03-31 DIAGNOSIS — Z87828 Personal history of other (healed) physical injury and trauma: Secondary | ICD-10-CM | POA: Diagnosis not present

## 2024-03-31 DIAGNOSIS — E1142 Type 2 diabetes mellitus with diabetic polyneuropathy: Secondary | ICD-10-CM | POA: Diagnosis not present

## 2024-03-31 DIAGNOSIS — E119 Type 2 diabetes mellitus without complications: Secondary | ICD-10-CM | POA: Diagnosis not present

## 2024-03-31 DIAGNOSIS — M2041 Other hammer toe(s) (acquired), right foot: Secondary | ICD-10-CM | POA: Diagnosis not present

## 2024-03-31 DIAGNOSIS — Z0189 Encounter for other specified special examinations: Secondary | ICD-10-CM | POA: Diagnosis not present

## 2024-03-31 DIAGNOSIS — M79674 Pain in right toe(s): Secondary | ICD-10-CM | POA: Diagnosis not present

## 2024-03-31 DIAGNOSIS — M2042 Other hammer toe(s) (acquired), left foot: Secondary | ICD-10-CM | POA: Diagnosis not present
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
## 2024-05-07 ENCOUNTER — Encounter: Payer: Self-pay | Admitting: Diagnostic Neuroimaging

## 2024-05-07 ENCOUNTER — Ambulatory Visit: Admitting: Diagnostic Neuroimaging

## 2024-05-07 VITALS — BP 126/76 | HR 57 | Ht 63.0 in | Wt 170.0 lb

## 2024-05-07 DIAGNOSIS — H532 Diplopia: Secondary | ICD-10-CM | POA: Diagnosis not present

## 2024-05-07 DIAGNOSIS — H02401 Unspecified ptosis of right eyelid: Secondary | ICD-10-CM | POA: Diagnosis not present

## 2024-05-07 DIAGNOSIS — R531 Weakness: Secondary | ICD-10-CM | POA: Diagnosis not present

## 2024-05-07 DIAGNOSIS — E1142 Type 2 diabetes mellitus with diabetic polyneuropathy: Secondary | ICD-10-CM

## 2024-05-07 DIAGNOSIS — R269 Unspecified abnormalities of gait and mobility: Secondary | ICD-10-CM | POA: Diagnosis not present

## 2024-05-07 DIAGNOSIS — Z7984 Long term (current) use of oral hypoglycemic drugs: Secondary | ICD-10-CM

## 2024-05-07 NOTE — Progress Notes (Signed)
 "  [facility]  PATIENT: Lisa Alexander DOB: [birth year]  REFERRING CLINICIAN: Theo Iha, MD HISTORY FROM: PATIENT  REASON FOR VISIT: NEW CONSULT    HISTORICAL  CHIEF COMPLAINT:  Chief Complaint  Patient presents with   RM 6    Patient is here with grand-daughter for worsening balance, unstable gait - 3 falls recently (October 1 and 2 in November. For the last two she went to the emergency room      HISTORY OF PRESENT ILLNESS:   84 year old female here for evaluation of gradual onset progressive gait balance difficulty since 2020.  History of hypertension, diabetes, arthritis.  Symptoms worse in the last 3 to 5 years.  She has worsening balance, coordination, weakness, difficulty walking.  She uses a walking stick.  She is trying to hold off from using a walker.  She has had at least 3 falls in the last few months.  Also has some knee pain issues rating to her low back.   REVIEW OF SYSTEMS: Full 14 system review of systems performed and negative with exception of: AS PER HPI.  ALLERGIES: Allergies[1]  HOME MEDICATIONS: Outpatient Medications Prior to Visit  Medication Sig Dispense Refill   acetaminophen (TYLENOL) 500 MG tablet Take 1,000 mg by mouth in the morning and at bedtime.     amLODipine (NORVASC) 10 MG tablet Take 10 mg by mouth every evening.     aspirin 81 MG tablet Take 81 mg by mouth every evening.     atenolol (TENORMIN) 50 MG tablet Take 50 mg by mouth 2 (two) times daily.     budesonide-formoterol (SYMBICORT) 80-4.5 MCG/ACT inhaler Inhale 1 puff into the lungs 2 (two) times daily as needed (sob and wheezing).     cetirizine (ZYRTEC) 10 MG tablet Take 10 mg by mouth daily as needed for allergies.     chlorthalidone (HYGROTON) 25 MG tablet Take 25 mg by mouth daily.     cholecalciferol (VITAMIN D3) 25 MCG (1000 UNIT) tablet Take 2,000 Units by mouth in the morning and at bedtime.     Cinnamon 500 MG TABS Take 500 mg by mouth in the  morning and at bedtime.     Continuous Glucose Sensor (DEXCOM G7 SENSOR) MISC SMARTSIG:1 Unit(s) Topical Every 10 Days     guaiFENesin (MUCINEX) 600 MG 12 hr tablet Take 600 mg by mouth daily.     Iron-FA-B Cmp-C-Biot-Probiotic (FUSION PLUS) CAPS Take 1 capsule by mouth daily.     JARDIANCE 10 MG TABS tablet Take 10 mg by mouth daily.     LANTUS SOLOSTAR 100 UNIT/ML Solostar Pen Inject 25 Units into the skin at bedtime.     Magnesium 250 MG TABS Take by mouth.     Menthol, Topical Analgesic, (BIOFREEZE) 4 % GEL Apply 1 application topically daily as needed (pain).     metFORMIN (GLUCOPHAGE) 500 MG tablet Take 500 mg by mouth 2 (two) times daily with a meal.     omeprazole (PRILOSEC) 40 MG capsule Take 40 mg by mouth daily.     OVER THE COUNTER MEDICATION Take 1 spray by mouth as needed (coughing). Biocidin Throat spray     simvastatin (ZOCOR) 20 MG tablet Take 20 mg by mouth every evening.     Turmeric Curcumin 500 MG CAPS Take 500 mg by mouth in the morning and at bedtime.     vitamin B-12 (CYANOCOBALAMIN) 1000 MCG tablet Take 1,000 mcg by mouth daily.     Vitamin E 670  MG (1000 UT) CAPS Take 1,000 Units by mouth daily.     Ferrous Sulfate (IRON PO) Take by mouth.     ciclopirox (PENLAC) 8 % solution Apply 1 application topically daily as needed (toe nails). (Patient not taking: Reported on 05/07/2024)     No facility-administered medications prior to visit.    PAST MEDICAL HISTORY: Past Medical History:  Diagnosis Date   Arthritis    Diabetes mellitus without complication (HCC)    GERD (gastroesophageal reflux disease)    High cholesterol    Hypertension     PAST SURGICAL HISTORY: Past Surgical History:  Procedure Laterality Date   BIOPSY  05/05/2021   Procedure: BIOPSY;  Surgeon: Rollin Dover, MD;  Location: [facility];  Service: Endoscopy;;   BREAST LUMPECTOMY WITH RADIOACTIVE SEED LOCALIZATION Right 06/21/2015   Procedure: BREAST LUMPECTOMY WITH RADIOACTIVE SEED  LOCALIZATION;  Surgeon: Debby Shipper, MD;  Location: [facility];  Service: General;  Laterality: Right;   COLONOSCOPY WITH PROPOFOL  N/A 05/05/2021   Procedure: COLONOSCOPY WITH PROPOFOL ;  Surgeon: Rollin Dover, MD;  Location: [facility];  Service: Endoscopy;  Laterality: N/A;   ENTEROSCOPY N/A 08/18/2021   Procedure: ENTEROSCOPY;  Surgeon: Rollin Dover, MD;  Location: [facility];  Service: Gastroenterology;  Laterality: N/A;   ESOPHAGOGASTRODUODENOSCOPY (EGD) WITH PROPOFOL  N/A 05/05/2021   Procedure: ESOPHAGOGASTRODUODENOSCOPY (EGD) WITH PROPOFOL ;  Surgeon: Rollin Dover, MD;  Location: [facility];  Service: Endoscopy;  Laterality: N/A;   GIVENS CAPSULE STUDY N/A 08/07/2021   Procedure: GIVENS CAPSULE STUDY;  Surgeon: Kristie Lamprey, MD;  Location: [facility];  Service: Gastroenterology;  Laterality: N/A;   JOINT REPLACEMENT Left    POLYPECTOMY  05/05/2021   Procedure: POLYPECTOMY;  Surgeon: Rollin Dover, MD;  Location: [facility];  Service: Endoscopy;;   TONSILLECTOMY     TUBAL LIGATION      FAMILY HISTORY: Family History  Problem Relation Age of Onset   Stroke Other    Migraines Neg Hx    Seizures Neg Hx    Sleep apnea Neg Hx     SOCIAL HISTORY: Social History   Socioeconomic History   Marital status: Single    Spouse name: Not on file   Number of children: Not on file   Years of education: Not on file   Highest education level: Not on file  Occupational History   Not on file  Tobacco Use   Smoking status: Never   Smokeless tobacco: Never  Vaping Use   Vaping status: Never Used  Substance and Sexual Activity   Alcohol use: No   Drug use: No   Sexual activity: Not on file  Other Topics Concern   Not on file  Social History Narrative   Drinks russian tea or 1 mug of coffee every other day   Social Drivers of Health   Tobacco Use: Low Risk (04/04/2024)   Received from [facility]   Patient History    Smoking Tobacco Use: Never     Smokeless Tobacco Use: Never    Passive Exposure: Not on file  Financial Resource Strain: Not on file  Food Insecurity: Not on file  Transportation Needs: Not on file  Physical Activity: Not on file  Stress: Not on file  Social Connections: Not on file  Intimate Partner Violence: Not At Risk (04/04/2024)   Received from [facility]   HITS    Over the last 12 months how often did your partner physically hurt you?: Never    Over the last 12  months how often did your partner insult you or talk down to you?: Never    Over the last 12 months how often did your partner threaten you with physical harm?: Never    Over the last 12 months how often did your partner scream or curse at you?: Never  Depression (PHQ2-9): Not on file  Alcohol Screen: Not on file  Housing: Not on file  Utilities: Not on file  Health Literacy: Not on file     PHYSICAL EXAM  GENERAL EXAM/CONSTITUTIONAL: Vitals:  Vitals:   05/07/24 1412  BP: 126/76  Pulse: (!) 57  SpO2: 98%  Weight: 170 lb (77.1 kg)  Height: 5' 3 (1.6 m)   Body mass index is 30.11 kg/m. Wt Readings from Last 3 Encounters:  05/07/24 170 lb (77.1 kg)  08/28/23 181 lb (82.1 kg)  05/04/22 181 lb (82.1 kg)   Patient is in no distress; well developed, nourished and groomed; neck is supple  CARDIOVASCULAR: Examination of carotid arteries is normal; no carotid bruits Regular rate and rhythm, no murmurs Examination of peripheral vascular system by observation and palpation is normal  EYES: Ophthalmoscopic exam of optic discs and posterior segments is normal; no papilledema or hemorrhages No results found.  MUSCULOSKELETAL: Gait, strength, tone, movements noted in Neurologic exam below  NEUROLOGIC: MENTAL STATUS:      No data to display         awake, alert, oriented to person, place and time recent and remote memory intact normal attention and concentration language fluent, comprehension intact, naming intact fund of  knowledge appropriate  CRANIAL NERVE:  2nd - no papilledema on fundoscopic exam 2nd, 3rd, 4th, 6th - pupils equal and reactive to light, visual fields full to confrontation, extraocular muscles intact, no nystagmus; RIGHT PTOSIS 5th - facial sensation symmetric 7th - facial strength symmetric 8th - hearing intact 9th - palate elevates symmetrically, uvula midline 11th - shoulder shrug symmetric 12th - tongue protrusion midline  MOTOR:  normal bulk and tone, full strength in the BUE, BLE; EXCEPT TRICEPS 3, HIP FLEXION 4+ FOOT DF 4+  SENSORY:  normal and symmetric to light touch, temperature, vibration; DECR IN FEET  COORDINATION:  finger-nose-finger, fine finger movements normal  REFLEXES:  deep tendon reflexes 1+ and symmetric  GAIT/STATION:  LIMPING ON RIGHT LEG; USING WALKING STAFF     DIAGNOSTIC DATA (LABS, IMAGING, TESTING) - I reviewed patient records, labs, notes, testing and imaging myself where available.  Lab Results  Component Value Date   WBC 6.9 05/04/2022   HGB 7.0 (L) 05/04/2022   HCT 26.2 (L) 05/04/2022   MCV 66.2 (L) 05/04/2022   PLT 470 (H) 05/04/2022      Component Value Date/Time   NA 128 (L) 05/04/2022 1003   K 3.4 (L) 05/04/2022 1003   CL 92 (L) 05/04/2022 1003   CO2 26 05/04/2022 1003   GLUCOSE 137 (H) 05/04/2022 1003   BUN 21 05/04/2022 1003   CREATININE 1.21 (H) 05/04/2022 1003   CALCIUM 10.0 05/04/2022 1003   GFRNONAA 45 (L) 05/04/2022 1003   GFRAA 50 (L) 06/15/2015 1202   No results found for: CHOL, HDL, LDLCALC, LDLDIRECT, TRIG, CHOLHDL No results found for: YHAJ8R No results found for: VITAMINB12 No results found for: TSH  05/11/20 Component Ref Range & Units 3 yr ago  Hemoglobin A1c 4.8 - 5.6 % 9.6 High    07/10/23 A1c 8.9   ASSESSMENT AND PLAN  84 y.o. year old female here with hypertension, diabetes,  arthritis here for evaluation of gait balance difficulty since 2020.  Dx:  1. Weakness   2.  Ptosis of eyelid, right   3. Double vision   4. Diabetic polyneuropathy associated with type 2 diabetes mellitus (HCC)   5. Gait difficulty      PLAN:  WEAKNESS / GAIT DIFF (intermittent symptoms; increasing falls; since ~2020; history of diabetes and arthritis) - check labs (neuropathy, myopathy, myasthenia eval) - refer to PT evaluation  RIGHT PTOSIS - incidental finding; chronic per patient (since last few years or since age 65 years old per patient?) - check myasthenia gravis and thyroid function testing panel  Orders Placed This Encounter  Procedures   CK   Aldolase   AChR Abs with Reflex to MuSK   Hemoglobin A1c   TSH Rfx on Abnormal to Free T4   Vitamin B12   Ambulatory referral to Physical Therapy   Return for pending test results, pending if symptoms worsen or fail to improve.    EDUARD FABIENE HANLON, MD 05/07/2024, 2:56 PM Certified in Neurology, Neurophysiology and Neuroimaging  [facility] [address] [place], [place] [address] [phone]     [1]  Allergies Allergen Reactions   Codeine Nausea And Vomiting    Other reaction(s): Dizziness, GI intolerance, Vomiting   Benzyl Alcohol     Other reaction(s): Sedation    Promethazine Other (See Comments)    Other reaction(s):   Sedation   Other Other (See Comments)    Phenergan   "

## 2024-05-07 NOTE — Patient Instructions (Signed)
 WEAKNESS / GAIT DIFF (intermittent symptoms; increasing falls; since ~2020; history of diabetes and arthritis) - check labs (neuropathy, myopathy, myasthenia eval) - refer to PT evaluation

## 2024-05-08 ENCOUNTER — Telehealth: Payer: Self-pay | Admitting: Diagnostic Neuroimaging

## 2024-05-08 LAB — TSH RFX ON ABNORMAL TO FREE T4: TSH: 1.37 u[IU]/mL (ref 0.450–4.500)

## 2024-05-08 NOTE — Telephone Encounter (Signed)
 Referral to Physical Therapy faxed to  [facility]  [facility] Phone:[phone] Fax: [phone]

## 2024-05-22 ENCOUNTER — Ambulatory Visit: Payer: Self-pay | Admitting: Diagnostic Neuroimaging

## 2024-05-22 LAB — HEMOGLOBIN A1C
Est. average glucose Bld gHb Est-mCnc: 169 mg/dL
Hgb A1c MFr Bld: 7.5 % — ABNORMAL HIGH (ref 4.8–5.6)

## 2024-05-22 LAB — ACHR ABS WITH REFLEX TO MUSK: AChR Binding Ab, Serum: <0.07 nmol/L (ref 0.00–0.24)

## 2024-05-22 LAB — MUSK ANTIBODIES: MuSK Antibodies: <1 U/mL

## 2024-05-22 LAB — ALDOLASE: Aldolase: 4.9 U/L (ref 3.3–10.3)

## 2024-05-22 LAB — CK: Total CK: 61 U/L (ref 26–161)

## 2024-05-22 LAB — VITAMIN B12: Vitamin B-12: >2000 pg/mL — ABNORMAL HIGH (ref 232–1245)

## 2024-07-14 ENCOUNTER — Ambulatory Visit: Admitting: Podiatry
